# Patient Record
Sex: Female | Born: 1995 | Hispanic: Yes | Marital: Married | Smoking: Never smoker
Health system: Southern US, Community
[De-identification: ages and names within clinical notes are randomized; demographics above are authoritative.]

## PROBLEM LIST (undated history)

## (undated) DIAGNOSIS — J45909 Unspecified asthma, uncomplicated: Secondary | ICD-10-CM

## (undated) DIAGNOSIS — Z789 Other specified health status: Secondary | ICD-10-CM

## (undated) HISTORY — PX: APPENDECTOMY: SHX54

---

## 2018-12-08 ENCOUNTER — Other Ambulatory Visit: Payer: Self-pay

## 2018-12-08 DIAGNOSIS — Z20822 Contact with and (suspected) exposure to covid-19: Secondary | ICD-10-CM

## 2018-12-08 DIAGNOSIS — Z20828 Contact with and (suspected) exposure to other viral communicable diseases: Secondary | ICD-10-CM | POA: Diagnosis not present

## 2018-12-09 LAB — NOVEL CORONAVIRUS, NAA: SARS-CoV-2, NAA: NOT DETECTED

## 2018-12-10 ENCOUNTER — Telehealth: Payer: Self-pay | Admitting: Hematology

## 2018-12-10 NOTE — Telephone Encounter (Signed)
Pt is aware covid 19 test is neg interpreter [phone] camilo on 12-10-2018

## 2018-12-24 ENCOUNTER — Ambulatory Visit: Payer: Self-pay | Attending: Internal Medicine

## 2018-12-24 DIAGNOSIS — Z20828 Contact with and (suspected) exposure to other viral communicable diseases: Secondary | ICD-10-CM | POA: Diagnosis not present

## 2018-12-24 DIAGNOSIS — Z20822 Contact with and (suspected) exposure to covid-19: Secondary | ICD-10-CM

## 2018-12-24 DIAGNOSIS — U071 COVID-19: Secondary | ICD-10-CM | POA: Insufficient documentation

## 2018-12-25 LAB — NOVEL CORONAVIRUS, NAA: SARS-CoV-2, NAA: DETECTED — AB

## 2018-12-27 ENCOUNTER — Ambulatory Visit: Payer: Self-pay

## 2018-12-27 NOTE — Telephone Encounter (Signed)
Using interpreter [phone], Pt given Covid-19 positive results. Discussed mild, moderate and severe symptoms. Advised pt to call 911 for any respiratory issues and/dehydration. Discussed non test criteria for ending self isolation. Pt advised of way to manage symptoms at home and review isolation precautions especially the importance of washing hands frequently and wearing a mask when around others. Pt verbalized understanding. Will notify HD.  Answer Assessment - Initial Assessment Questions 1. REASON FOR CALL or QUESTION: "What is your reason for calling today?" or "How can I best help you?" or "What question do you have that I can help answer?"     Covid results  Protocols used: INFORMATION ONLY CALL-A-AH

## 2019-01-02 NOTE — L&D Delivery Note (Signed)
Delivery Summary for Stacey Wilcox  Labor Events:   Preterm labor: No data found  Rupture date: 09/24/2019  Rupture time: 8:01 AM  Rupture type: Artificial  Fluid Color: Clear  Induction: No data found  Augmentation: No data found  Complications: No data found  Cervical ripening: No data found No data found   No data found     Delivery:   Episiotomy: No data found  Lacerations: No data found  Repair suture: No data found  Repair # of packets: No data found  Blood loss (ml): 350   Information for the patient's newborn:  Daizee, Firmin [[id number]]    Delivery 09/24/2019 10:01 AM by  Vaginal, Spontaneous Sex:  female Gestational Age: [redacted]w[redacted]d Delivery Clinician:   Living?:         APGARS  One minute Five minutes Ten minutes  Skin color:        Heart rate:        Grimace:        Muscle tone:        Breathing:        Totals: 9  9      Presentation/position:      Resuscitation:   Cord information:    Disposition of cord blood:     Blood gases sent?  Complications:   Placenta: Delivered:       appearance Newborn Measurements: Weight: 5 lb 14.9 oz (2690 g)  Height: 19.76"  Head circumference:    Chest circumference:    Other providers:    Additional  information: Forceps:   Vacuum:   Breech:   Observed anomalies        Delivery Note At 10:01 AM a viable and healthy female "Diego" was delivered via Vaginal, Spontaneous (Presentation: Left Occiput Anterior).  APGAR: 9, 9; weight 5 lb 14.9 oz (2690 g).   Placenta status: Spontaneous, Intact.  Cord: 3-vessel with the following complications: None.  Cord pH: not obtained.  Delayed cord clamping observed for 2 minutes.   Anesthesia: Epidural Episiotomy: None Lacerations: 2nd degree Suture Repair: 3.0 vicryl rapide Est. Blood Loss (mL):  350 ml  Mom to postpartum.  Baby to Couplet care / Skin to Skin.  Hildred Laser 09/24/2019, 1:20 PM

## 2019-02-02 DIAGNOSIS — Z1389 Encounter for screening for other disorder: Secondary | ICD-10-CM | POA: Diagnosis not present

## 2019-02-02 DIAGNOSIS — O219 Vomiting of pregnancy, unspecified: Secondary | ICD-10-CM | POA: Diagnosis not present

## 2019-03-03 DIAGNOSIS — Z3481 Encounter for supervision of other normal pregnancy, first trimester: Secondary | ICD-10-CM | POA: Diagnosis not present

## 2019-03-03 DIAGNOSIS — Z1389 Encounter for screening for other disorder: Secondary | ICD-10-CM | POA: Diagnosis not present

## 2019-03-03 LAB — OB RESULTS CONSOLE VARICELLA ZOSTER ANTIBODY, IGG: Varicella: IMMUNE

## 2019-03-03 LAB — OB RESULTS CONSOLE HEPATITIS B SURFACE ANTIGEN: Hepatitis B Surface Ag: NEGATIVE

## 2019-03-03 LAB — OB RESULTS CONSOLE RUBELLA ANTIBODY, IGM: Rubella: NON-IMMUNE/NOT IMMUNE

## 2019-03-30 ENCOUNTER — Emergency Department: Payer: Medicaid Other

## 2019-03-30 ENCOUNTER — Encounter: Payer: Self-pay | Admitting: Emergency Medicine

## 2019-03-30 ENCOUNTER — Emergency Department
Admission: EM | Admit: 2019-03-30 | Discharge: 2019-03-31 | Disposition: A | Discharge status: Home / Self Care | Payer: Medicaid Other | Attending: Emergency Medicine | Admitting: Emergency Medicine

## 2019-03-30 DIAGNOSIS — Z3A16 16 weeks gestation of pregnancy: Secondary | ICD-10-CM | POA: Insufficient documentation

## 2019-03-30 DIAGNOSIS — R109 Unspecified abdominal pain: Secondary | ICD-10-CM | POA: Diagnosis not present

## 2019-03-30 DIAGNOSIS — R102 Pelvic and perineal pain: Secondary | ICD-10-CM | POA: Diagnosis not present

## 2019-03-30 DIAGNOSIS — O4402 Placenta previa specified as without hemorrhage, second trimester: Secondary | ICD-10-CM | POA: Diagnosis not present

## 2019-03-30 DIAGNOSIS — O26892 Other specified pregnancy related conditions, second trimester: Secondary | ICD-10-CM | POA: Diagnosis present

## 2019-03-30 DIAGNOSIS — R1030 Lower abdominal pain, unspecified: Secondary | ICD-10-CM | POA: Diagnosis not present

## 2019-03-30 DIAGNOSIS — O26891 Other specified pregnancy related conditions, first trimester: Secondary | ICD-10-CM | POA: Diagnosis not present

## 2019-03-30 LAB — URINALYSIS, COMPLETE (UACMP) WITH MICROSCOPIC
Bilirubin Urine: NEGATIVE
Glucose, UA: NEGATIVE mg/dL
Hgb urine dipstick: NEGATIVE
Ketones, ur: NEGATIVE mg/dL
Leukocytes,Ua: NEGATIVE
Nitrite: NEGATIVE
Protein, ur: NEGATIVE mg/dL
Specific Gravity, Urine: 1.008 (ref 1.005–1.030)
pH: 7 (ref 5.0–8.0)

## 2019-03-30 LAB — SAMPLE TO BLOOD BANK

## 2019-03-30 LAB — COMPREHENSIVE METABOLIC PANEL
ALT: 21 U/L (ref 0–44)
AST: 20 U/L (ref 15–41)
Albumin: 4 g/dL (ref 3.5–5.0)
Alkaline Phosphatase: 74 U/L (ref 38–126)
Anion gap: 11 (ref 5–15)
BUN: 5 mg/dL — ABNORMAL LOW (ref 6–20)
CO2: 23 mmol/L (ref 22–32)
Calcium: 9 mg/dL (ref 8.9–10.3)
Chloride: 102 mmol/L (ref 98–111)
Creatinine, Ser: 0.45 mg/dL (ref 0.44–1.00)
GFR calc Af Amer: >60 mL/min (ref >60)
GFR calc non Af Amer: >60 mL/min (ref >60)
Glucose, Bld: 88 mg/dL (ref 70–99)
Potassium: 3.5 mmol/L (ref 3.5–5.1)
Sodium: 136 mmol/L (ref 135–145)
Total Bilirubin: 0.3 mg/dL (ref 0.3–1.2)
Total Protein: 7.6 g/dL (ref 6.5–8.1)

## 2019-03-30 LAB — CBC
HCT: 38.5 % (ref 36.0–46.0)
Hemoglobin: 13.1 g/dL (ref 12.0–15.0)
MCH: 29.2 pg (ref 26.0–34.0)
MCHC: 34 g/dL (ref 30.0–36.0)
MCV: 85.7 fL (ref 80.0–100.0)
Platelets: 343 10*3/uL (ref 150–400)
RBC: 4.49 MIL/uL (ref 3.87–5.11)
RDW: 12.7 % (ref 11.5–15.5)
WBC: 13.5 10*3/uL — ABNORMAL HIGH (ref 4.0–10.5)
nRBC: 0 % (ref 0.0–0.2)

## 2019-03-30 LAB — LIPASE, BLOOD: Lipase: 25 U/L (ref 11–51)

## 2019-03-30 LAB — ABO/RH: ABO/RH(D): O POS

## 2019-03-30 LAB — HCG, QUANTITATIVE, PREGNANCY: hCG, Beta Chain, Quant, S: 26376 m[IU]/mL — ABNORMAL HIGH (ref <5)

## 2019-03-30 NOTE — ED Triage Notes (Signed)
Pt reports she is [redacted] weeks pregnant and today started to have lower pelvic pain and dysuria. [facility].

## 2019-03-30 NOTE — ED Notes (Signed)
Interpretor used for pt communication and through assessment with self and Dr. Derrill Kay. Pt states she is concerned she may be experiencing a miscarriage. States she is experiencing lower abdominal pain and pelvic pain. Tender to palpation in pubic region

## 2019-03-30 NOTE — ED Notes (Signed)
Pt ready for US

## 2019-03-30 NOTE — ED Provider Notes (Signed)
[facility] Emergency Department Provider Note   ____________________________________________   I have reviewed the triage vital signs and the nursing notes.   HISTORY  Chief Complaint Abdominal Pain   History limited by: [facility] Interpreter utilized   HPI Stacey Wilcox is a 24 y.o. female who presents to the emergency department today because of concerns for suprapubic pain.  She states that the pain started this morning. It has been constant. She denies any associated vaginal bleeding or abnormal vaginal discharge. She states that the pain does remind her of when she had a miscarriage last year. She has noticed some discomfort with urination. Denies any fevers. She states she did have an ultrasound on this pregnancy already.    Records reviewed.   There are no problems to display for this patient.   Prior to Admission medications   Not on File    Allergies Patient has no known allergies.  No family history on file.  Social History Social History   Tobacco Use  . Smoking status: Not on file  Substance Use Topics  . Alcohol use: Not on file  . Drug use: Not on file    Review of Systems Constitutional: No fever/chills Eyes: No visual changes. ENT: No sore throat. Cardiovascular: Denies chest pain. Respiratory: Denies shortness of breath. Gastrointestinal: Positive for suprapubic pain.  Genitourinary: Negative for dysuria. Musculoskeletal: Negative for back pain. Skin: Negative for rash. Neurological: Negative for headaches, focal weakness or numbness.  ____________________________________________   PHYSICAL EXAM:  VITAL SIGNS: ED Triage Vitals [03/30/19 2018]  Enc Vitals Group     BP (!) 145/80     Pulse Rate 86     Resp 17     Temp      Temp src      SpO2 100 %   Constitutional: Alert and oriented.  Eyes: Conjunctivae are normal.  ENT      Head: Normocephalic and atraumatic.      Nose:  No congestion/rhinnorhea.      Mouth/Throat: Mucous membranes are moist.      Neck: No stridor. Hematological/Lymphatic/Immunilogical: No cervical lymphadenopathy. Cardiovascular: Normal rate, regular rhythm.  No murmurs, rubs, or gallops.  Respiratory: Normal respiratory effort without tachypnea nor retractions. Breath sounds are clear and equal bilaterally. No wheezes/rales/rhonchi. Gastrointestinal: Soft and tender to palpation in the suprapubic region. Genitourinary: Deferred Musculoskeletal: Normal range of motion in all extremities. No lower extremity edema. Neurologic:  Normal speech and language. No gross focal neurologic deficits are appreciated.  Skin:  Skin is warm, dry and intact. No rash noted. Psychiatric: Tearful ____________________________________________    LABS (pertinent positives/negatives)  UA hazy, rare bacteria otherwise unremarkable CMP wnl except BUN 5 Lipase 25 CBC wbc 13.5, hgb 13.1, plt 343 ____________________________________________   EKG  None  ____________________________________________    RADIOLOGY  US ob pending at time of signout  ____________________________________________   PROCEDURES  Procedures  ____________________________________________   INITIAL IMPRESSION / ASSESSMENT AND PLAN / ED COURSE  Pertinent labs & imaging results that were available during my care of the patient were reviewed by me and considered in my medical decision making (see chart for details).   Patient presented to the emergency department today because of concern for suprapubic pain in the setting of pregnancy. She had a miscarriage last year and feels like this pain is similar. No concerning signs for UTI in UA. Will obtain US to evaluate.    ____________________________________________   FINAL CLINICAL IMPRESSION(S) /  ED DIAGNOSES  Suprapubic pain  Note: This dictation was prepared with Dragon dictation. Any transcriptional errors that result  from this process are unintentional     Phineas Semen, MD 03/30/19 [phone]

## 2019-03-30 NOTE — ED Notes (Signed)
PT to US

## 2019-03-31 DIAGNOSIS — Z3481 Encounter for supervision of other normal pregnancy, first trimester: Secondary | ICD-10-CM | POA: Diagnosis not present

## 2019-03-31 DIAGNOSIS — Z3A16 16 weeks gestation of pregnancy: Secondary | ICD-10-CM | POA: Diagnosis not present

## 2019-03-31 DIAGNOSIS — O26891 Other specified pregnancy related conditions, first trimester: Secondary | ICD-10-CM | POA: Diagnosis not present

## 2019-03-31 DIAGNOSIS — R102 Pelvic and perineal pain: Secondary | ICD-10-CM | POA: Diagnosis not present

## 2019-03-31 DIAGNOSIS — O4402 Placenta previa specified as without hemorrhage, second trimester: Secondary | ICD-10-CM | POA: Diagnosis not present

## 2019-03-31 LAB — URINE CULTURE: Culture: <10000 — AB

## 2019-03-31 NOTE — ED Provider Notes (Signed)
-----------------------------------------   12:44 AM on 03/31/2019 -----------------------------------------  OB ultrasound interpreted per Dr. Karle Starch:  Single live intrauterine gestation at 16 weeks 0 days.    Low lying placenta is noted at this time. Follow-up can be performed  as clinically indicated.   Updated patient ultrasound results.  She will follow-up with OB/GYN closely.  Strict return precautions given.  Patient verbalizes understanding and agrees with plan of care.   Irean Hong, MD 03/31/19 [phone]

## 2019-03-31 NOTE — Discharge Instructions (Signed)
You may take Tylenol as needed for discomfort.  Practice pelvic rest until seen by your doctor.  Urine culture is pending.  We will notify you of any positive results.  Return to the ER for worsening symptoms, persistent vomiting, difficulty breathing or other concerns.

## 2019-04-20 DIAGNOSIS — Z20822 Contact with and (suspected) exposure to covid-19: Secondary | ICD-10-CM | POA: Diagnosis not present

## 2019-04-28 DIAGNOSIS — Z1389 Encounter for screening for other disorder: Secondary | ICD-10-CM | POA: Diagnosis not present

## 2019-04-28 DIAGNOSIS — Z3482 Encounter for supervision of other normal pregnancy, second trimester: Secondary | ICD-10-CM | POA: Diagnosis not present

## 2019-05-26 DIAGNOSIS — Z3A23 23 weeks gestation of pregnancy: Secondary | ICD-10-CM | POA: Diagnosis not present

## 2019-05-26 DIAGNOSIS — O26892 Other specified pregnancy related conditions, second trimester: Secondary | ICD-10-CM | POA: Diagnosis not present

## 2019-05-26 DIAGNOSIS — Z362 Encounter for other antenatal screening follow-up: Secondary | ICD-10-CM | POA: Diagnosis not present

## 2019-05-27 DIAGNOSIS — Z3482 Encounter for supervision of other normal pregnancy, second trimester: Secondary | ICD-10-CM | POA: Diagnosis not present

## 2019-06-22 DIAGNOSIS — Z1389 Encounter for screening for other disorder: Secondary | ICD-10-CM | POA: Diagnosis not present

## 2019-06-22 DIAGNOSIS — Z23 Encounter for immunization: Secondary | ICD-10-CM | POA: Diagnosis not present

## 2019-06-22 DIAGNOSIS — Z3482 Encounter for supervision of other normal pregnancy, second trimester: Secondary | ICD-10-CM | POA: Diagnosis not present

## 2019-06-23 LAB — OB RESULTS CONSOLE RPR: RPR: NONREACTIVE

## 2019-06-23 LAB — OB RESULTS CONSOLE HIV ANTIBODY (ROUTINE TESTING): HIV: NONREACTIVE

## 2019-07-02 DIAGNOSIS — Z419 Encounter for procedure for purposes other than remedying health state, unspecified: Secondary | ICD-10-CM | POA: Diagnosis not present

## 2019-07-09 DIAGNOSIS — Z3482 Encounter for supervision of other normal pregnancy, second trimester: Secondary | ICD-10-CM | POA: Diagnosis not present

## 2019-07-09 DIAGNOSIS — Z1389 Encounter for screening for other disorder: Secondary | ICD-10-CM | POA: Diagnosis not present

## 2019-07-23 DIAGNOSIS — O09613 Supervision of young primigravida, third trimester: Secondary | ICD-10-CM | POA: Diagnosis not present

## 2019-07-23 DIAGNOSIS — Z1389 Encounter for screening for other disorder: Secondary | ICD-10-CM | POA: Diagnosis not present

## 2019-08-02 DIAGNOSIS — Z419 Encounter for procedure for purposes other than remedying health state, unspecified: Secondary | ICD-10-CM | POA: Diagnosis not present

## 2019-08-06 DIAGNOSIS — Z1389 Encounter for screening for other disorder: Secondary | ICD-10-CM | POA: Diagnosis not present

## 2019-08-06 DIAGNOSIS — O09613 Supervision of young primigravida, third trimester: Secondary | ICD-10-CM | POA: Diagnosis not present

## 2019-08-20 DIAGNOSIS — Z1389 Encounter for screening for other disorder: Secondary | ICD-10-CM | POA: Diagnosis not present

## 2019-08-20 DIAGNOSIS — O09613 Supervision of young primigravida, third trimester: Secondary | ICD-10-CM | POA: Diagnosis not present

## 2019-08-21 LAB — OB RESULTS CONSOLE GC/CHLAMYDIA
Chlamydia: NEGATIVE
Gonorrhea: NEGATIVE

## 2019-08-22 LAB — OB RESULTS CONSOLE GBS: GBS: NEGATIVE

## 2019-08-27 DIAGNOSIS — Z3403 Encounter for supervision of normal first pregnancy, third trimester: Secondary | ICD-10-CM | POA: Diagnosis not present

## 2019-08-27 DIAGNOSIS — Z1389 Encounter for screening for other disorder: Secondary | ICD-10-CM | POA: Diagnosis not present

## 2019-09-02 DIAGNOSIS — Z419 Encounter for procedure for purposes other than remedying health state, unspecified: Secondary | ICD-10-CM | POA: Diagnosis not present

## 2019-09-03 DIAGNOSIS — Z3403 Encounter for supervision of normal first pregnancy, third trimester: Secondary | ICD-10-CM | POA: Diagnosis not present

## 2019-09-10 DIAGNOSIS — Z1389 Encounter for screening for other disorder: Secondary | ICD-10-CM | POA: Diagnosis not present

## 2019-09-10 DIAGNOSIS — O09613 Supervision of young primigravida, third trimester: Secondary | ICD-10-CM | POA: Diagnosis not present

## 2019-09-17 DIAGNOSIS — Z1389 Encounter for screening for other disorder: Secondary | ICD-10-CM | POA: Diagnosis not present

## 2019-09-17 DIAGNOSIS — O09613 Supervision of young primigravida, third trimester: Secondary | ICD-10-CM | POA: Diagnosis not present

## 2019-09-21 DIAGNOSIS — Z20822 Contact with and (suspected) exposure to covid-19: Secondary | ICD-10-CM | POA: Diagnosis not present

## 2019-09-21 DIAGNOSIS — O09613 Supervision of young primigravida, third trimester: Secondary | ICD-10-CM | POA: Diagnosis not present

## 2019-09-23 ENCOUNTER — Observation Stay
Admission: EM | Admit: 2019-09-23 | Discharge: 2019-09-23 | Disposition: A | Discharge status: Home / Self Care | Payer: Medicaid Other | Attending: Obstetrics and Gynecology | Admitting: Obstetrics and Gynecology

## 2019-09-23 ENCOUNTER — Other Ambulatory Visit: Payer: Self-pay

## 2019-09-23 DIAGNOSIS — Z3490 Encounter for supervision of normal pregnancy, unspecified, unspecified trimester: Secondary | ICD-10-CM

## 2019-09-23 DIAGNOSIS — Z3A40 40 weeks gestation of pregnancy: Secondary | ICD-10-CM | POA: Diagnosis not present

## 2019-09-23 DIAGNOSIS — O471 False labor at or after 37 completed weeks of gestation: Secondary | ICD-10-CM | POA: Diagnosis not present

## 2019-09-23 DIAGNOSIS — O480 Post-term pregnancy: Secondary | ICD-10-CM | POA: Diagnosis present

## 2019-09-23 DIAGNOSIS — O479 False labor, unspecified: Secondary | ICD-10-CM | POA: Diagnosis present

## 2019-09-23 NOTE — OB Triage Note (Signed)
Discharge instructions and labor precautions reviewed with patient, father of baby, and mother of patient.

## 2019-09-23 NOTE — Final Progress Note (Signed)
L&D OB Triage Note  Stacey Wilcox is an unassigned 24 y.o. G1P0 female at [redacted]w[redacted]d, EDD Estimated Date of Delivery: 09/18/19 who presented to triage for complaints of contractions since this morning. She receives her care at [facility]. She was seen at [facility] this morning for contractions and was noted to be dilated 3 cm, but did not progress any further. She is still noting contractions every 4 minutes and pain of 10/10. She was evaluated by the nurses with no significant findings for active labor.  Vital signs stable. An NST was performed and has been reviewed by MD.   Physical Exam:  Blood pressure 127/86, temperature 98.7 F (37.1 C), temperature source Oral, resp. rate 12. Dilation: 3 Effacement (%): 80, 90 Cervical Position: Middle Station: -2 Presentation: Vertex Exam by:: A Sherlon Handing RN     NST INTERPRETATION: Indications: rule out uterine contractions  Mode: External Baseline Rate (A): 130 bpm Variability: Moderate Accelerations: 10 x 10 Decelerations: None     Contraction Frequency (min): irriability with occasional contractions  Impression: reactive   Plan: NST performed was reviewed and was found to be reactive. No further cervical change noted during labor evaluation. She was discharged home with bleeding/labor precautions.  Continue routine prenatal care. Follow up with OB/GYN as previously scheduled.     Hildred Laser, MD

## 2019-09-23 NOTE — Discharge Instructions (Signed)
Return to hospital for any of the following:  -contractions at least 5 minutes apart  -decreased fetal movement  -vaginal bleeding  -leakage of fluid  

## 2019-09-23 NOTE — OB Triage Note (Signed)
Pt reports being at [facility] this morning and dilated to 3cm. She was sent home and has returned here for ctx every 4 mins apart and rating them 10/10. Cervical check now is 3/90/-2.

## 2019-09-24 ENCOUNTER — Inpatient Hospital Stay: Payer: Medicaid Other | Admitting: Anesthesiology

## 2019-09-24 ENCOUNTER — Inpatient Hospital Stay
Admission: EM | Admit: 2019-09-24 | Discharge: 2019-09-25 | DRG: 807 | Disposition: A | Discharge status: Home / Self Care | Payer: Medicaid Other | Attending: Obstetrics and Gynecology | Admitting: Obstetrics and Gynecology

## 2019-09-24 ENCOUNTER — Other Ambulatory Visit: Payer: Self-pay

## 2019-09-24 DIAGNOSIS — O99214 Obesity complicating childbirth: Secondary | ICD-10-CM | POA: Diagnosis not present

## 2019-09-24 DIAGNOSIS — Z3A40 40 weeks gestation of pregnancy: Secondary | ICD-10-CM

## 2019-09-24 DIAGNOSIS — Z20822 Contact with and (suspected) exposure to covid-19: Secondary | ICD-10-CM | POA: Diagnosis present

## 2019-09-24 DIAGNOSIS — O480 Post-term pregnancy: Secondary | ICD-10-CM | POA: Diagnosis not present

## 2019-09-24 DIAGNOSIS — O26893 Other specified pregnancy related conditions, third trimester: Secondary | ICD-10-CM | POA: Diagnosis not present

## 2019-09-24 HISTORY — DX: Other specified health status: Z78.9

## 2019-09-24 LAB — CBC
HCT: 38.2 % (ref 36.0–46.0)
Hemoglobin: 13 g/dL (ref 12.0–15.0)
MCH: 28.4 pg (ref 26.0–34.0)
MCHC: 34 g/dL (ref 30.0–36.0)
MCV: 83.6 fL (ref 80.0–100.0)
Platelets: 292 10*3/uL (ref 150–400)
RBC: 4.57 MIL/uL (ref 3.87–5.11)
RDW: 14.7 % (ref 11.5–15.5)
WBC: 16.6 10*3/uL — ABNORMAL HIGH (ref 4.0–10.5)
nRBC: 0 % (ref 0.0–0.2)

## 2019-09-24 LAB — CHLAMYDIA/NGC RT PCR (ARMC ONLY)
Chlamydia Tr: NOT DETECTED
N gonorrhoeae: NOT DETECTED

## 2019-09-24 LAB — SARS CORONAVIRUS 2 BY RT PCR (HOSPITAL ORDER, PERFORMED IN ~~LOC~~ HOSPITAL LAB): SARS Coronavirus 2: NEGATIVE

## 2019-09-24 LAB — TYPE AND SCREEN
ABO/RH(D): O POS
Antibody Screen: NEGATIVE

## 2019-09-24 LAB — ABO/RH: ABO/RH(D): O POS

## 2019-09-24 MED ORDER — ONDANSETRON HCL 4 MG PO TABS
4.0000 mg | ORAL_TABLET | ORAL | Status: DC | PRN
  Filled 2019-09-24: qty 1

## 2019-09-24 MED ORDER — ACETAMINOPHEN 325 MG PO TABS
ORAL_TABLET | ORAL | Status: AC
  Filled 2019-09-24: qty 2

## 2019-09-24 MED ORDER — SOD CITRATE-CITRIC ACID 500-334 MG/5ML PO SOLN: 30.0000 mL | ORAL | Status: DC | PRN

## 2019-09-24 MED ORDER — FENTANYL 2.5 MCG/ML W/ROPIVACAINE 0.15% IN NS 100 ML EPIDURAL (ARMC): 12.0000 mL/h | EPIDURAL | Status: DC

## 2019-09-24 MED ORDER — TETANUS-DIPHTH-ACELL PERTUSSIS 5-2.5-18.5 LF-MCG/0.5 IM SUSP
0.5000 mL | Freq: Once | INTRAMUSCULAR | Status: DC
  Filled 2019-09-24: qty 0.5

## 2019-09-24 MED ORDER — SODIUM CHLORIDE 0.9 % IV SOLN
INTRAVENOUS | Status: DC | PRN
  Administered 2019-09-24: 10 mL via EPIDURAL

## 2019-09-24 MED ORDER — ONDANSETRON HCL 4 MG/2ML IJ SOLN: 4.0000 mg | Freq: Four times a day (QID) | INTRAMUSCULAR | Status: DC | PRN

## 2019-09-24 MED ORDER — MISOPROSTOL 200 MCG PO TABS
ORAL_TABLET | ORAL | Status: AC
  Filled 2019-09-24: qty 4

## 2019-09-24 MED ORDER — OXYTOCIN-SODIUM CHLORIDE 30-0.9 UT/500ML-% IV SOLN
1.0000 m[IU]/min | INTRAVENOUS | Status: DC
  Administered 2019-09-24: 4 m[IU]/min via INTRAVENOUS

## 2019-09-24 MED ORDER — OXYTOCIN-SODIUM CHLORIDE 30-0.9 UT/500ML-% IV SOLN
INTRAVENOUS | Status: AC
  Filled 2019-09-24: qty 500

## 2019-09-24 MED ORDER — OXYTOCIN-SODIUM CHLORIDE 30-0.9 UT/500ML-% IV SOLN: 1.0000 m[IU]/min | INTRAVENOUS | Status: DC

## 2019-09-24 MED ORDER — PHENYLEPHRINE 40 MCG/ML (10ML) SYRINGE FOR IV PUSH (FOR BLOOD PRESSURE SUPPORT): 80.0000 ug | PREFILLED_SYRINGE | INTRAVENOUS | Status: DC | PRN

## 2019-09-24 MED ORDER — LIDOCAINE-EPINEPHRINE (PF) 1.5 %-1:200000 IJ SOLN
INTRAMUSCULAR | Status: DC | PRN
  Administered 2019-09-24: 3 mL via EPIDURAL

## 2019-09-24 MED ORDER — ACETAMINOPHEN 325 MG PO TABS
650.0000 mg | ORAL_TABLET | ORAL | Status: DC | PRN
  Administered 2019-09-24 – 2019-09-25 (×2): 650 mg via ORAL
  Filled 2019-09-24 (×2): qty 2

## 2019-09-24 MED ORDER — ONDANSETRON HCL 4 MG/2ML IJ SOLN: 4.0000 mg | INTRAMUSCULAR | Status: DC | PRN

## 2019-09-24 MED ORDER — DIPHENHYDRAMINE HCL 50 MG/ML IJ SOLN: 12.5000 mg | INTRAMUSCULAR | Status: DC | PRN

## 2019-09-24 MED ORDER — WITCH HAZEL-GLYCERIN EX PADS: 1.0000 "application " | MEDICATED_PAD | CUTANEOUS | Status: DC | PRN

## 2019-09-24 MED ORDER — LIDOCAINE HCL (PF) 1 % IJ SOLN
INTRAMUSCULAR | Status: AC
  Filled 2019-09-24: qty 30

## 2019-09-24 MED ORDER — DIBUCAINE (PERIANAL) 1 % EX OINT: 1.0000 "application " | TOPICAL_OINTMENT | CUTANEOUS | Status: DC | PRN

## 2019-09-24 MED ORDER — TERBUTALINE SULFATE 1 MG/ML IJ SOLN: 0.2500 mg | Freq: Once | INTRAMUSCULAR | Status: DC | PRN

## 2019-09-24 MED ORDER — ZOLPIDEM TARTRATE 5 MG PO TABS: 5.0000 mg | ORAL_TABLET | Freq: Every evening | ORAL | Status: DC | PRN

## 2019-09-24 MED ORDER — LIDOCAINE HCL (PF) 1 % IJ SOLN: 30.0000 mL | INTRAMUSCULAR | Status: DC | PRN

## 2019-09-24 MED ORDER — SENNOSIDES-DOCUSATE SODIUM 8.6-50 MG PO TABS
2.0000 | ORAL_TABLET | ORAL | Status: DC
  Administered 2019-09-24: 2 via ORAL
  Filled 2019-09-24: qty 2

## 2019-09-24 MED ORDER — PRENATAL MULTIVITAMIN CH
1.0000 | ORAL_TABLET | Freq: Every day | ORAL | Status: DC
  Administered 2019-09-24 – 2019-09-25 (×2): 1 via ORAL
  Filled 2019-09-24 (×2): qty 1

## 2019-09-24 MED ORDER — IBUPROFEN 600 MG PO TABS
ORAL_TABLET | ORAL | Status: AC
  Filled 2019-09-24: qty 1

## 2019-09-24 MED ORDER — OXYTOCIN-SODIUM CHLORIDE 30-0.9 UT/500ML-% IV SOLN
2.5000 [IU]/h | INTRAVENOUS | Status: DC
  Administered 2019-09-24: 2.5 [IU]/h via INTRAVENOUS
  Filled 2019-09-24: qty 500

## 2019-09-24 MED ORDER — OXYTOCIN 10 UNIT/ML IJ SOLN
INTRAMUSCULAR | Status: AC
  Filled 2019-09-24: qty 2

## 2019-09-24 MED ORDER — BUTORPHANOL TARTRATE 1 MG/ML IJ SOLN: 1.0000 mg | INTRAMUSCULAR | Status: DC | PRN

## 2019-09-24 MED ORDER — BENZOCAINE-MENTHOL 20-0.5 % EX AERO
1.0000 "application " | INHALATION_SPRAY | CUTANEOUS | Status: DC | PRN
  Administered 2019-09-24: 1 via TOPICAL
  Filled 2019-09-24: qty 56

## 2019-09-24 MED ORDER — OXYCODONE-ACETAMINOPHEN 5-325 MG PO TABS: 1.0000 | ORAL_TABLET | ORAL | Status: DC | PRN

## 2019-09-24 MED ORDER — EPHEDRINE 5 MG/ML INJ: 10.0000 mg | INTRAVENOUS | Status: DC | PRN

## 2019-09-24 MED ORDER — FENTANYL 2.5 MCG/ML W/ROPIVACAINE 0.15% IN NS 100 ML EPIDURAL (ARMC)
EPIDURAL | Status: AC
  Filled 2019-09-24: qty 100

## 2019-09-24 MED ORDER — LACTATED RINGERS IV SOLN
500.0000 mL | Freq: Once | INTRAVENOUS | Status: AC
  Administered 2019-09-24: 500 mL via INTRAVENOUS

## 2019-09-24 MED ORDER — DIPHENHYDRAMINE HCL 25 MG PO CAPS: 25.0000 mg | ORAL_CAPSULE | Freq: Four times a day (QID) | ORAL | Status: DC | PRN

## 2019-09-24 MED ORDER — FENTANYL 2.5 MCG/ML W/ROPIVACAINE 0.15% IN NS 100 ML EPIDURAL (ARMC)
EPIDURAL | Status: DC | PRN
Start: 2019-09-24 — End: 2019-09-24
  Administered 2019-09-24: 12 mL/h via EPIDURAL

## 2019-09-24 MED ORDER — SIMETHICONE 80 MG PO CHEW: 80.0000 mg | CHEWABLE_TABLET | ORAL | Status: DC | PRN

## 2019-09-24 MED ORDER — COCONUT OIL OIL: 1.0000 "application " | TOPICAL_OIL | Status: DC | PRN

## 2019-09-24 MED ORDER — LACTATED RINGERS IV BOLUS
1000.0000 mL | Freq: Once | INTRAVENOUS | Status: AC
  Administered 2019-09-24: 1000 mL via INTRAVENOUS

## 2019-09-24 MED ORDER — IBUPROFEN 600 MG PO TABS
600.0000 mg | ORAL_TABLET | Freq: Four times a day (QID) | ORAL | Status: DC
  Administered 2019-09-24 – 2019-09-25 (×4): 600 mg via ORAL
  Filled 2019-09-24 (×4): qty 1

## 2019-09-24 MED ORDER — OXYTOCIN BOLUS FROM INFUSION
333.0000 mL | Freq: Once | INTRAVENOUS | Status: AC
  Administered 2019-09-24: 333 mL via INTRAVENOUS

## 2019-09-24 MED ORDER — LACTATED RINGERS IV SOLN
125.0000 mL/h | INTRAVENOUS | Status: DC
  Administered 2019-09-24: 125 mL/h via INTRAVENOUS

## 2019-09-24 MED ORDER — LIDOCAINE HCL (PF) 1 % IJ SOLN
INTRAMUSCULAR | Status: DC | PRN
  Administered 2019-09-24: 3 mL

## 2019-09-24 MED ORDER — LACTATED RINGERS IV SOLN: 500.0000 mL | INTRAVENOUS | Status: DC | PRN

## 2019-09-24 MED ORDER — AMMONIA AROMATIC IN INHA
RESPIRATORY_TRACT | Status: AC
  Filled 2019-09-24: qty 10

## 2019-09-24 MED ORDER — OXYCODONE-ACETAMINOPHEN 5-325 MG PO TABS: 2.0000 | ORAL_TABLET | ORAL | Status: DC | PRN

## 2019-09-24 NOTE — H&P (Signed)
Obstetric History and Physical  Video Language Line interpreter Edelin, used for Spanish interpretation.   Stacey Wilcox is an unassigned 24 y.o. G1P0 with IUP at [redacted]w[redacted]d presenting for complaints of worsening contractions and bloody show. Of note, patient was seen in triage last night due to contractions, but was deemed not to be in labor at that evaluation. Patient states she has been having  regular, every 4-5 minutes contractions, minimal vaginal bleeding, intact membranes, with active fetal movement.    Prenatal Course Source of Care: [facility] with onset of care at 10 weeks Pregnancy complications or risks: Patient Active Problem List   Diagnosis Date Noted  . Indication for care or intervention in labor or delivery 09/24/2019  . Post-dates pregnancy 09/24/2019  . Labor and delivery indication for care or intervention 09/24/2019   She plans to bottle feed She desires unsure method for postpartum contraception.   Prenatal labs and studies: ABO, Rh: --/--/O POS Performed at [facility], [address] [place], [place] [address]  [phone]) Antibody: NEG (09/23 7858) Rubella: Nonimmune (03/02 0000) RPR: Nonreactive (06/22 0000)  HBsAg: Negative (03/02 0000)  HIV: Non-reactive (06/22 0000)  IFO:YDXAJOIN/-- (08/21 0000) 1 hr Glucola  normal Genetic screening normal Anatomy US normal   Past Medical History:  Diagnosis Date  . Medical history non-contributory     Past Surgical History:  Procedure Laterality Date  . APPENDECTOMY      OB History  Gravida Para Term Preterm AB Living  1            SAB TAB Ectopic Multiple Live Births               # Outcome Date GA Lbr Len/2nd Weight Sex Delivery Anes PTL Lv  1 Current             Social History   Socioeconomic History  . Marital status: Not on file    Spouse name: Not on file  . Number of children: Not on file  . Years of education: Not on file  . Highest education level: Not on file   Occupational History  . Not on file  Tobacco Use  . Smoking status: Never Smoker  . Smokeless tobacco: Never Used  Substance and Sexual Activity  . Alcohol use: Not Currently  . Drug use: Not Currently  . Sexual activity: Yes  Other Topics Concern  . Not on file  Social History Narrative  . Not on file   Social Determinants of Health   Financial Resource Strain:   . Difficulty of Paying Living Expenses: Not on file  Food Insecurity:   . Worried About Programme researcher, broadcasting/film/video in the Last Year: Not on file  . Ran Out of Food in the Last Year: Not on file  Transportation Needs:   . Lack of Transportation (Medical): Not on file  . Lack of Transportation (Non-Medical): Not on file  Physical Activity:   . Days of Exercise per Week: Not on file  . Minutes of Exercise per Session: Not on file  Stress:   . Feeling of Stress : Not on file  Social Connections:   . Frequency of Communication with Friends and Family: Not on file  . Frequency of Social Gatherings with Friends and Family: Not on file  . Attends Religious Services: Not on file  . Active Member of Clubs or Organizations: Not on file  . Attends Banker Meetings: Not on file  . Marital Status:  Not on file    History reviewed. No pertinent family history.  Medications Prior to Admission  Medication Sig Dispense Refill Last Dose  . prenatal vitamin w/FE, FA (PRENATAL 1 + 1) 27-1 MG TABS tablet Take 1 tablet by mouth daily at 12 noon.   09/23/2019 at Unknown time    No Known Allergies  Review of Systems: Negative except for what is mentioned in HPI.  Physical Exam: BP 120/77 (BP Location: Left Arm)   Pulse 74   Temp 98.6 F (37 C) (Oral)   Resp 14   Ht 5' 1.02" (1.55 m)   Wt 98.9 kg   SpO2 98%   BMI 41.16 kg/m  CONSTITUTIONAL: Well-developed, well-nourished female in no acute distress.  HENT:  Normocephalic, atraumatic, External right and left ear normal. Oropharynx is clear and moist EYES:  Conjunctivae and EOM are normal. Pupils are equal, round, and reactive to light. No scleral icterus.  NECK: Normal range of motion, supple, no masses SKIN: Skin is warm and dry. No rash noted. Not diaphoretic. No erythema. No pallor. NEUROLOGIC: Alert and oriented to person, place, and time. Normal reflexes, muscle tone coordination. No cranial nerve deficit noted. PSYCHIATRIC: Normal mood and affect. Normal behavior. Normal judgment and thought content. CARDIOVASCULAR: Normal heart rate noted, regular rhythm RESPIRATORY: Effort and breath sounds normal, no problems with respiration noted ABDOMEN: Soft, nontender, nondistended, gravid. MUSCULOSKELETAL: Normal range of motion. No edema and no tenderness. 2+ distal pulses.  Cervical Exam: Dilatation 7 cm   Effacement 80%   Station -1 to 0   Presentation: cephalic FHT:  Baseline rate 130 bpm   Variability moderate  Accelerations present   Decelerations none Contractions: Every 2-7 mins   Pertinent Labs/Studies:   Results for orders placed or performed during the hospital encounter of 09/24/19 (from the past 24 hour(s))  SARS Coronavirus 2 by RT PCR (hospital order, performed in [facility] hospital lab) Nasopharyngeal Nasopharyngeal Swab     Status: None   Collection Time: 09/24/19  3:40 AM   Specimen: Nasopharyngeal Swab  Result Value Ref Range   SARS Coronavirus 2 NEGATIVE NEGATIVE  CBC     Status: Abnormal   Collection Time: 09/24/19  3:40 AM  Result Value Ref Range   WBC 16.6 (H) 4.0 - 10.5 K/uL   RBC 4.57 3.87 - 5.11 MIL/uL   Hemoglobin 13.0 12.0 - 15.0 g/dL   HCT 13.0 36 - 46 %   MCV 83.6 80.0 - 100.0 fL   MCH 28.4 26.0 - 34.0 pg   MCHC 34.0 30.0 - 36.0 g/dL   RDW 86.5 78.4 - 69.6 %   Platelets 292 150 - 400 K/uL   nRBC 0.0 0.0 - 0.2 %  Chlamydia/NGC rt PCR ([facility] only)     Status: None   Collection Time: 09/24/19  3:40 AM   Specimen: Urine; GU  Result Value Ref Range   Specimen source GC/Chlam URINE, RANDOM    Chlamydia Tr  NOT DETECTED NOT DETECTED   N gonorrhoeae NOT DETECTED NOT DETECTED  Type and screen     Status: None   Collection Time: 09/24/19  6:47 AM  Result Value Ref Range   ABO/RH(D) O POS    Antibody Screen NEG    Sample Expiration      09/27/2019,2359 Performed at [facility], [address], [place], [place] [address]   ABO/Rh     Status: None   Collection Time: 09/24/19  6:52 AM  Result Value Ref Range  ABO/RH(D)      [facility] [place] POS Performed at [facility], [address], [place], [place] [address]     Assessment : Stacey Wilcox is a 24 y.o. G1P0 at [redacted]w[redacted]d being admitted for labor.   Plan: Labor: Have been managing expectantly, but cervical exam unchanged after 2 hrs. Contractions noted to space out after epidural placement. AROM'd with scan meconium-stained fluid. Will perform Augmentation with Pitocin.  FWB: Reassuring fetal heart tracing.  GBS negative Delivery plan: Hopeful for vaginal delivery    Hildred Laser, MD [facility]

## 2019-09-24 NOTE — Anesthesia Preprocedure Evaluation (Signed)
Anesthesia Evaluation  Patient identified by MRN, date of birth, ID band Patient awake    Reviewed: Allergy & Precautions, NPO status , Patient's Chart, lab work & pertinent test results  History of Anesthesia Complications Negative for: history of anesthetic complications  Airway Mallampati: III  TM Distance: >3 FB Neck ROM: Full    Dental no notable dental hx. (+) Teeth Intact   Pulmonary neg pulmonary ROS, neg sleep apnea, neg COPD, Patient abstained from smoking.Not current smoker,    Pulmonary exam normal breath sounds clear to auscultation       Cardiovascular Exercise Tolerance: Good METS(-) hypertension(-) CAD and (-) Past MI negative cardio ROS  (-) dysrhythmias  Rhythm:Regular Rate:Normal - Systolic murmurs    Neuro/Psych negative neurological ROS  negative psych ROS   GI/Hepatic neg GERD  ,(+)     (-) substance abuse  ,   Endo/Other  neg diabetesMorbid obesity  Renal/GU negative Renal ROS     Musculoskeletal   Abdominal   Peds  Hematology   Anesthesia Other Findings Past Medical History: No date: Medical history non-contributory  Reproductive/Obstetrics (+) Pregnancy                             Anesthesia Physical Anesthesia Plan  ASA: III  Anesthesia Plan: Epidural   Post-op Pain Management:    Induction:   PONV Risk Score and Plan: 3 and Treatment may vary due to age or medical condition and Ondansetron  Airway Management Planned: Natural Airway  Additional Equipment:   Intra-op Plan:   Post-operative Plan:   Informed Consent: I have reviewed the patients History and Physical, chart, labs and discussed the procedure including the risks, benefits and alternatives for the proposed anesthesia with the patient or authorized representative who has indicated his/her understanding and acceptance.     Interpreter used for [employer] (bedside nurse Ariceli acting  as interpreter)  Plan Discussed with: Surgeon  Anesthesia Plan Comments: (Discussed R/B/A of neuraxial anesthesia technique with patient: - rare risks of spinal/epidural hematoma, nerve damage, infection - Risk of PDPH - Risk of itching - Risk of nausea and vomiting - Risk of poor block necessitating replacement of epidural. Patient voiced understanding.)        Anesthesia Quick Evaluation

## 2019-09-24 NOTE — Anesthesia Procedure Notes (Signed)
Epidural Patient location during procedure: OB Start time: 09/24/2019 5:04 AM End time: 09/24/2019 5:26 AM  Staffing Anesthesiologist: Corinda Gubler, MD Performed: anesthesiologist   Preanesthetic Checklist Completed: patient identified, IV checked, site marked, risks and benefits discussed, surgical consent, monitors and equipment checked, pre-op evaluation and timeout performed  Epidural Patient position: sitting Prep: ChloraPrep Patient monitoring: heart rate, continuous pulse ox and blood pressure Approach: midline Location: L3-L4 Injection technique: LOR saline  Needle:  Needle type: Tuohy  Needle gauge: 17 G Needle length: 9 cm and 9 Needle insertion depth: 6 cm Catheter type: closed end flexible Catheter size: 19 Gauge Catheter at skin depth: 11 cm Test dose: negative and 1.5% lidocaine with Epi 1:200 K  Assessment Sensory level: T10 Events: blood not aspirated, injection not painful, no injection resistance, no paresthesia and negative IV test  Additional Notes one attempt Pt. Evaluated and documentation done after procedure finished. Patient identified. Risks/Benefits/Options discussed with patient including but not limited to bleeding, infection, nerve damage, paralysis, failed block, incomplete pain control, headache, blood pressure changes, nausea, vomiting, reactions to medication both or allergic, itching and postpartum back pain. Confirmed with bedside nurse the patient's most recent platelet count. Confirmed with patient that they are not currently taking any anticoagulation, have any bleeding history or any family history of bleeding disorders. Patient expressed understanding and wished to proceed. All questions were answered. Sterile technique was used throughout the entire procedure. Please see nursing notes for vital signs. Test dose was given through epidural catheter and negative prior to continuing to dose epidural or start infusion. Warning signs of high block  given to the patient including shortness of breath, tingling/numbness in hands, complete motor block, or any concerning symptoms with instructions to call for help. Patient was given instructions on fall risk and not to get out of bed. All questions and concerns addressed with instructions to call with any issues or inadequate analgesia.   Patient tolerated the insertion well without immediate complications.Reason for block:procedure for pain

## 2019-09-24 NOTE — OB Triage Note (Signed)
Pt returns to unit c/o contractions and increased bloody show since being sent home earlier tonight. Pt reports +FM, denies LOF.

## 2019-09-25 LAB — CBC
HCT: 35.4 % — ABNORMAL LOW (ref 36.0–46.0)
Hemoglobin: 11.9 g/dL — ABNORMAL LOW (ref 12.0–15.0)
MCH: 29.2 pg (ref 26.0–34.0)
MCHC: 33.6 g/dL (ref 30.0–36.0)
MCV: 86.8 fL (ref 80.0–100.0)
Platelets: 236 10*3/uL (ref 150–400)
RBC: 4.08 MIL/uL (ref 3.87–5.11)
RDW: 15.3 % (ref 11.5–15.5)
WBC: 16.5 10*3/uL — ABNORMAL HIGH (ref 4.0–10.5)
nRBC: 0 % (ref 0.0–0.2)

## 2019-09-25 LAB — RPR: RPR Ser Ql: NONREACTIVE

## 2019-09-25 MED ORDER — IBUPROFEN 600 MG PO TABS
600.0000 mg | ORAL_TABLET | Freq: Four times a day (QID) | ORAL | 0 refills | Status: DC
Start: 2019-09-25 — End: 2021-11-16

## 2019-09-25 MED ORDER — INFLUENZA VAC SPLIT QUAD 0.5 ML IM SUSY: 0.5000 mL | PREFILLED_SYRINGE | INTRAMUSCULAR | Status: DC

## 2019-09-25 NOTE — Progress Notes (Signed)
Post Partum Day # 1, s/p SVD  Language Line Interpreter used for this encounter    Subjective: no complaints, up ad lib, voiding and tolerating PO  Objective: Temp:  [98 F (36.7 C)-98.9 F (37.2 C)] 98.6 F (37 C) (09/24 0733) Pulse Rate:  [66-83] 66 (09/24 0733) Resp:  [16-20] 18 (09/24 0733) BP: (99-111)/(64-77) 111/77 (09/24 0733) SpO2:  [98 %-100 %] 100 % (09/24 0733)  Physical Exam:  General: alert and no distress  Lungs: clear to auscultation bilaterally Breasts: normal appearance, no masses or tenderness Heart: regular rate and rhythm, S1, S2 normal, no murmur, click, rub or gallop Abdomen: soft, non-tender; bowel sounds normal; no masses,  no organomegaly Pelvis: Lochia: appropriate, Uterine Fundus: firm Extremities: DVT Evaluation: No evidence of DVT seen on physical exam. Negative Homan's sign. No cords or calf tenderness. No significant calf/ankle edema.  Recent Labs    09/24/19 0340 09/25/19 0527  HGB 13.0 11.9*  HCT 38.2 35.4*    Assessment/Plan: Doing well postpartum  Formula feeding  Contraception: undecided. To discuss with her OB provider at her postpartum visit  Declines circumcision Dispo: can d/c home today or tomorrow pending newborn status Patient will follow with her provider at [facility]     LOS: 1 day   Hildred Laser, MD [facility]

## 2019-09-25 NOTE — Discharge Summary (Signed)
Postpartum Discharge Summary     Patient Name: Stacey Wilcox DOB: 1995-10-02 MRN: [id number]  Date of admission: 09/24/2019 Delivery date:09/24/2019  Delivering provider: Rubie Maid  Date of discharge: 09/25/2019  Admitting diagnosis: Labor and delivery indication for care or intervention [O75.9] Intrauterine pregnancy: [redacted]w[redacted]d    Secondary diagnosis:  Active Problems:   Indication for care or intervention in labor or delivery   Labor and delivery indication for care or intervention  Additional problems: None   Discharge diagnosis: Term Pregnancy Delivered                                              Post partum procedures:None Augmentation: AROM and Pitocin Complications: None  Hospital course: Onset of Labor With Vaginal Delivery      24y.o. yo G1P1001 at 458w6das admitted in Active Labor on 09/24/2019. Patient had an uncomplicated labor course as follows:  Membrane Rupture Time/Date: 8:01 AM ,09/24/2019   Delivery Method:Vaginal, Spontaneous  Episiotomy: None  Lacerations:  2nd degree  Patient had an uncomplicated postpartum course.  She is ambulating, tolerating a regular diet, passing flatus, and urinating well. Patient is discharged home in stable condition on 09/27/19.  Newborn Data: Birth date:09/24/2019  Birth time:10:01 AM  Gender:Female  Living status:Living  Apgars:9 ,9  Weight:2690 g   Magnesium Sulfate received: No BMZ received: No Rhophylac:N/A MMR:No T-DaP:Given prenatally Flu: Yes Transfusion:No  Physical exam  Vitals:   09/24/19 1914 09/24/19 2302 09/25/19 0312 09/25/19 0733  BP: 110/67 99/69 107/64 111/77  Pulse: 83 73 68 66  Resp: _0 Temp: 98.4 F (36.9 C) 98 F (36.7 C) 98.3 F (36.8 C) 98.6 F (37 C)  TempSrc: Oral Oral Oral Oral  SpO2: 98% 98% 99% 100%  Weight:      Height:       General: alert Lochia: appropriate Uterine Fundus: firm Incision: N/A DVT Evaluation: No evidence of DVT seen on physical  exam. Negative Homan's sign. No cords or calf tenderness. No significant calf/ankle edema. Labs: Lab Results  Component Value Date   WBC 16.5 (H) 09/25/2019   HGB 11.9 (L) 09/25/2019   HCT 35.4 (L) 09/25/2019   MCV 86.8 09/25/2019   PLT 236 09/25/2019   No flowsheet data found. Edinburgh Score: Edinburgh Postnatal Depression Scale Screening Tool 09/24/2019  I have been able to laugh and see the funny side of things. 1  I have looked forward with enjoyment to things. 1  I have blamed myself unnecessarily when things went wrong. 0  I have been anxious or worried for no good reason. 0  I have felt scared or panicky for no good reason. 0  Things have been getting on top of me. 0  I have been so unhappy that I have had difficulty sleeping. 0  I have felt sad or miserable. 0  I have been so unhappy that I have been crying. 0  The thought of harming myself has occurred to me. 0  Edinburgh Postnatal Depression Scale Total 2      After visit meds:  Allergies as of 09/25/2019   No Known Allergies     Medication List    TAKE these medications   ibuprofen 600 MG tablet Commonly known as: ADVIL Take 1 tablet (600 mg total) by mouth every 6 (six) hours.  prenatal vitamin w/FE, FA 27-1 MG Tabs tablet Take 1 tablet by mouth daily at 12 noon.        Discharge home in stable condition Infant Feeding: Bottle Infant Disposition:home with mother Discharge instruction: per After Visit Summary and Postpartum booklet. Activity: Advance as tolerated. Pelvic rest for 6 weeks.  Diet: routine diet Anticipated Birth Control: Unsure Postpartum Appointment:6 weeks Additional Postpartum F/U: None Future Appointments:No future appointments. Follow up Visit:  [place], [facility]. Go in 6 week(s).   Contact information: [address] [place] [address] [phone]                   09/25/2019 Rubie Maid, MD

## 2019-09-25 NOTE — Progress Notes (Signed)
Patient discharged home with infant. Discharge instructions and prescriptions given and reviewed with patient with spanish interpreter. Patient verbalized understanding. Escorted out by auxillary.  

## 2019-09-25 NOTE — Anesthesia Postprocedure Evaluation (Signed)
Anesthesia Post Note  Patient: Stacey Wilcox  Procedure(s) Performed: AN AD HOC LABOR EPIDURAL  Patient location during evaluation: Mother Baby Anesthesia Type: Epidural Level of consciousness: awake and alert Pain management: pain level controlled Vital Signs Assessment: post-procedure vital signs reviewed and stable Respiratory status: spontaneous breathing, nonlabored ventilation and respiratory function stable Cardiovascular status: stable Postop Assessment: no headache, no backache and epidural receding Anesthetic complications: no   No complications documented.   Last Vitals:  Vitals:   09/25/19 0312 09/25/19 0733  BP: 107/64 111/77  Pulse: 68 66  Resp: 18 18  Temp: 36.8 C 37 C  SpO2: 99% 100%    Last Pain:  Vitals:   09/25/19 0930  TempSrc:   PainSc: 0-No pain                 Lister Brizzi Lawerance Cruel

## 2019-10-02 DIAGNOSIS — Z419 Encounter for procedure for purposes other than remedying health state, unspecified: Secondary | ICD-10-CM | POA: Diagnosis not present

## 2019-10-19 DIAGNOSIS — Z23 Encounter for immunization: Secondary | ICD-10-CM | POA: Diagnosis not present

## 2019-10-19 DIAGNOSIS — Z1389 Encounter for screening for other disorder: Secondary | ICD-10-CM | POA: Diagnosis not present

## 2019-10-19 DIAGNOSIS — Z7189 Other specified counseling: Secondary | ICD-10-CM | POA: Diagnosis not present

## 2019-11-02 DIAGNOSIS — Z419 Encounter for procedure for purposes other than remedying health state, unspecified: Secondary | ICD-10-CM | POA: Diagnosis not present

## 2019-11-05 ENCOUNTER — Encounter: Payer: Self-pay | Admitting: Obstetrics and Gynecology

## 2019-11-09 DIAGNOSIS — Z130 Encounter for screening for diseases of the blood and blood-forming organs and certain disorders involving the immune mechanism: Secondary | ICD-10-CM | POA: Diagnosis not present

## 2019-11-09 DIAGNOSIS — Z7189 Other specified counseling: Secondary | ICD-10-CM | POA: Diagnosis not present

## 2019-11-09 DIAGNOSIS — Z1389 Encounter for screening for other disorder: Secondary | ICD-10-CM | POA: Diagnosis not present

## 2019-11-09 DIAGNOSIS — Z23 Encounter for immunization: Secondary | ICD-10-CM | POA: Diagnosis not present

## 2019-11-09 DIAGNOSIS — R87612 Low grade squamous intraepithelial lesion on cytologic smear of cervix (LGSIL): Secondary | ICD-10-CM | POA: Diagnosis not present

## 2019-11-09 DIAGNOSIS — Z0289 Encounter for other administrative examinations: Secondary | ICD-10-CM | POA: Diagnosis not present

## 2019-11-18 DIAGNOSIS — Z1389 Encounter for screening for other disorder: Secondary | ICD-10-CM | POA: Diagnosis not present

## 2019-11-18 DIAGNOSIS — R591 Generalized enlarged lymph nodes: Secondary | ICD-10-CM | POA: Diagnosis not present

## 2019-11-30 DIAGNOSIS — R229 Localized swelling, mass and lump, unspecified: Secondary | ICD-10-CM | POA: Diagnosis not present

## 2019-11-30 DIAGNOSIS — R591 Generalized enlarged lymph nodes: Secondary | ICD-10-CM | POA: Diagnosis not present

## 2019-12-02 DIAGNOSIS — Z419 Encounter for procedure for purposes other than remedying health state, unspecified: Secondary | ICD-10-CM | POA: Diagnosis not present

## 2020-01-02 DIAGNOSIS — Z419 Encounter for procedure for purposes other than remedying health state, unspecified: Secondary | ICD-10-CM | POA: Diagnosis not present

## 2020-02-02 DIAGNOSIS — Z419 Encounter for procedure for purposes other than remedying health state, unspecified: Secondary | ICD-10-CM | POA: Diagnosis not present

## 2020-03-01 DIAGNOSIS — Z419 Encounter for procedure for purposes other than remedying health state, unspecified: Secondary | ICD-10-CM | POA: Diagnosis not present

## 2020-04-01 DIAGNOSIS — Z419 Encounter for procedure for purposes other than remedying health state, unspecified: Secondary | ICD-10-CM | POA: Diagnosis not present

## 2020-05-01 DIAGNOSIS — Z419 Encounter for procedure for purposes other than remedying health state, unspecified: Secondary | ICD-10-CM | POA: Diagnosis not present

## 2020-06-01 DIAGNOSIS — Z419 Encounter for procedure for purposes other than remedying health state, unspecified: Secondary | ICD-10-CM | POA: Diagnosis not present

## 2020-07-01 DIAGNOSIS — Z419 Encounter for procedure for purposes other than remedying health state, unspecified: Secondary | ICD-10-CM | POA: Diagnosis not present

## 2020-08-01 DIAGNOSIS — Z419 Encounter for procedure for purposes other than remedying health state, unspecified: Secondary | ICD-10-CM | POA: Diagnosis not present

## 2020-09-01 DIAGNOSIS — Z419 Encounter for procedure for purposes other than remedying health state, unspecified: Secondary | ICD-10-CM | POA: Diagnosis not present

## 2020-10-01 DIAGNOSIS — Z419 Encounter for procedure for purposes other than remedying health state, unspecified: Secondary | ICD-10-CM | POA: Diagnosis not present

## 2020-11-01 DIAGNOSIS — Z419 Encounter for procedure for purposes other than remedying health state, unspecified: Secondary | ICD-10-CM | POA: Diagnosis not present

## 2020-12-01 DIAGNOSIS — Z419 Encounter for procedure for purposes other than remedying health state, unspecified: Secondary | ICD-10-CM | POA: Diagnosis not present

## 2020-12-06 DIAGNOSIS — Z124 Encounter for screening for malignant neoplasm of cervix: Secondary | ICD-10-CM | POA: Diagnosis not present

## 2020-12-06 DIAGNOSIS — Z113 Encounter for screening for infections with a predominantly sexual mode of transmission: Secondary | ICD-10-CM | POA: Diagnosis not present

## 2020-12-06 DIAGNOSIS — G43909 Migraine, unspecified, not intractable, without status migrainosus: Secondary | ICD-10-CM | POA: Diagnosis not present

## 2020-12-06 DIAGNOSIS — Z0000 Encounter for general adult medical examination without abnormal findings: Secondary | ICD-10-CM | POA: Diagnosis not present

## 2020-12-06 DIAGNOSIS — R87610 Atypical squamous cells of undetermined significance on cytologic smear of cervix (ASC-US): Secondary | ICD-10-CM | POA: Diagnosis not present

## 2020-12-06 DIAGNOSIS — Z1389 Encounter for screening for other disorder: Secondary | ICD-10-CM | POA: Diagnosis not present

## 2020-12-06 DIAGNOSIS — Z01419 Encounter for gynecological examination (general) (routine) without abnormal findings: Secondary | ICD-10-CM | POA: Diagnosis not present

## 2021-01-01 DIAGNOSIS — Z419 Encounter for procedure for purposes other than remedying health state, unspecified: Secondary | ICD-10-CM | POA: Diagnosis not present

## 2021-01-01 NOTE — L&D Delivery Note (Signed)
Delivery Note  Date of delivery: 11/15/2021 Estimated Date of Delivery: 11/12/21 Patient's last menstrual period was 02/05/2021. EGA: [redacted]w[redacted]d  Delivery Note At 1:29 PM a viable female was delivered via Vaginal, Spontaneous (Presentation: Right Occiput Anterior).  APGAR: 9, 9; weight pending.  Placenta status: Spontaneous, Intact.  Cord: 3 vessels with the following complications: None.  Cord pH: n/a  First Stage: Labor onset: unknown Augmentation : Pitocin and AROM Analgesia /Anesthesia intrapartum: Epidural AROM at 0937  Stacey Wilcox presented to L&D with active labor. She was augmented with pitocin. Epidural placed for pain relief.   Second Stage: Complete dilation at 1317 Onset of pushing at 1325 FHR second stage Cat I, Cat II with imminent delivery Delivery at 1329 on 11/15/2021  She progressed to complete and had a spontaneous vaginal birth of a live female over an intact perineum. The fetal head was delivered in OA position with restitution to ROA. No nuchal cord. Anterior then posterior shoulders delivered spontaneously. Baby placed on mom's abdomen and attended to by transition RN. Cord clamped and cut after 1+ min  by FOB. Cord segment given to nurse for gases. Cord blood obtained for newborn labs.  Third Stage: Placenta delivered intact with 3VC at 1335 with trailing membranes Placenta disposition: routine disposal Uterine tone firm / bleeding min IV pitocin given for hemorrhage prophylaxis  Anesthesia: Epidural Episiotomy: None Lacerations: 2nd degree;Perineal Suture Repair: 2.0 vicryl Est. Blood Loss (mL):  21mL  Complications: none  Mom to postpartum.  Baby to Couplet care / Skin to Skin.  Newborn: Birth Weight: pending  Apgar Scores: 9; 9 Feeding planned: Breastfeeding   Cyril Mourning, CNM 11/15/2021 1:58 PM

## 2021-01-11 DIAGNOSIS — R87610 Atypical squamous cells of undetermined significance on cytologic smear of cervix (ASC-US): Secondary | ICD-10-CM | POA: Diagnosis not present

## 2021-01-11 DIAGNOSIS — Z23 Encounter for immunization: Secondary | ICD-10-CM | POA: Diagnosis not present

## 2021-02-01 DIAGNOSIS — Z419 Encounter for procedure for purposes other than remedying health state, unspecified: Secondary | ICD-10-CM | POA: Diagnosis not present

## 2021-03-01 DIAGNOSIS — Z419 Encounter for procedure for purposes other than remedying health state, unspecified: Secondary | ICD-10-CM | POA: Diagnosis not present

## 2021-03-21 DIAGNOSIS — Z0000 Encounter for general adult medical examination without abnormal findings: Secondary | ICD-10-CM | POA: Diagnosis not present

## 2021-03-21 DIAGNOSIS — Z3200 Encounter for pregnancy test, result unknown: Secondary | ICD-10-CM | POA: Diagnosis not present

## 2021-03-21 DIAGNOSIS — Z3201 Encounter for pregnancy test, result positive: Secondary | ICD-10-CM | POA: Diagnosis not present

## 2021-03-21 DIAGNOSIS — Z1389 Encounter for screening for other disorder: Secondary | ICD-10-CM | POA: Diagnosis not present

## 2021-04-01 DIAGNOSIS — Z419 Encounter for procedure for purposes other than remedying health state, unspecified: Secondary | ICD-10-CM | POA: Diagnosis not present

## 2021-04-29 ENCOUNTER — Emergency Department: Payer: Medicaid Other

## 2021-04-29 ENCOUNTER — Emergency Department
Admission: EM | Admit: 2021-04-29 | Discharge: 2021-04-30 | Disposition: A | Discharge status: Home / Self Care | Payer: Medicaid Other | Attending: Emergency Medicine | Admitting: Emergency Medicine

## 2021-04-29 ENCOUNTER — Other Ambulatory Visit: Payer: Self-pay

## 2021-04-29 ENCOUNTER — Encounter: Payer: Self-pay | Admitting: Emergency Medicine

## 2021-04-29 DIAGNOSIS — O99891 Other specified diseases and conditions complicating pregnancy: Secondary | ICD-10-CM | POA: Diagnosis not present

## 2021-04-29 DIAGNOSIS — M545 Low back pain, unspecified: Secondary | ICD-10-CM | POA: Insufficient documentation

## 2021-04-29 DIAGNOSIS — R1030 Lower abdominal pain, unspecified: Secondary | ICD-10-CM | POA: Diagnosis not present

## 2021-04-29 DIAGNOSIS — Z3A12 12 weeks gestation of pregnancy: Secondary | ICD-10-CM | POA: Insufficient documentation

## 2021-04-29 DIAGNOSIS — W010XXA Fall on same level from slipping, tripping and stumbling without subsequent striking against object, initial encounter: Secondary | ICD-10-CM | POA: Insufficient documentation

## 2021-04-29 DIAGNOSIS — O26891 Other specified pregnancy related conditions, first trimester: Secondary | ICD-10-CM | POA: Insufficient documentation

## 2021-04-29 DIAGNOSIS — Z3A11 11 weeks gestation of pregnancy: Secondary | ICD-10-CM | POA: Diagnosis not present

## 2021-04-29 DIAGNOSIS — Y9301 Activity, walking, marching and hiking: Secondary | ICD-10-CM | POA: Insufficient documentation

## 2021-04-29 DIAGNOSIS — W19XXXA Unspecified fall, initial encounter: Secondary | ICD-10-CM

## 2021-04-29 DIAGNOSIS — Z3201 Encounter for pregnancy test, result positive: Secondary | ICD-10-CM | POA: Diagnosis not present

## 2021-04-29 LAB — CBC WITH DIFFERENTIAL/PLATELET
Abs Immature Granulocytes: 0.06 10*3/uL (ref 0.00–0.07)
Basophils Absolute: 0 10*3/uL (ref 0.0–0.1)
Basophils Relative: 0 %
Eosinophils Absolute: 0.1 10*3/uL (ref 0.0–0.5)
Eosinophils Relative: 1 %
HCT: 36.9 % (ref 36.0–46.0)
Hemoglobin: 12.2 g/dL (ref 12.0–15.0)
Immature Granulocytes: 0 %
Lymphocytes Relative: 29 %
Lymphs Abs: 4 10*3/uL (ref 0.7–4.0)
MCH: 28.5 pg (ref 26.0–34.0)
MCHC: 33.1 g/dL (ref 30.0–36.0)
MCV: 86.2 fL (ref 80.0–100.0)
Monocytes Absolute: 0.8 10*3/uL (ref 0.1–1.0)
Monocytes Relative: 6 %
Neutro Abs: 8.8 10*3/uL — ABNORMAL HIGH (ref 1.7–7.7)
Neutrophils Relative %: 64 %
Platelets: 323 10*3/uL (ref 150–400)
RBC: 4.28 MIL/uL (ref 3.87–5.11)
RDW: 12.5 % (ref 11.5–15.5)
WBC: 13.8 10*3/uL — ABNORMAL HIGH (ref 4.0–10.5)
nRBC: 0 % (ref 0.0–0.2)

## 2021-04-29 LAB — URINALYSIS, COMPLETE (UACMP) WITH MICROSCOPIC
Bilirubin Urine: NEGATIVE
Glucose, UA: NEGATIVE mg/dL
Hgb urine dipstick: NEGATIVE
Ketones, ur: NEGATIVE mg/dL
Nitrite: NEGATIVE
Protein, ur: NEGATIVE mg/dL
Specific Gravity, Urine: 1.025 (ref 1.005–1.030)
WBC, UA: NONE SEEN WBC/hpf (ref 0–5)
pH: 6 (ref 5.0–8.0)

## 2021-04-29 LAB — COMPREHENSIVE METABOLIC PANEL
ALT: 12 U/L (ref 0–44)
AST: 15 U/L (ref 15–41)
Albumin: 3.9 g/dL (ref 3.5–5.0)
Alkaline Phosphatase: 67 U/L (ref 38–126)
Anion gap: 11 (ref 5–15)
BUN: 13 mg/dL (ref 6–20)
CO2: 22 mmol/L (ref 22–32)
Calcium: 9 mg/dL (ref 8.9–10.3)
Chloride: 105 mmol/L (ref 98–111)
Creatinine, Ser: 0.68 mg/dL (ref 0.44–1.00)
GFR, Estimated: >60 mL/min (ref >60)
Glucose, Bld: 98 mg/dL (ref 70–99)
Potassium: 3.6 mmol/L (ref 3.5–5.1)
Sodium: 138 mmol/L (ref 135–145)
Total Bilirubin: <0.1 mg/dL — ABNORMAL LOW (ref 0.3–1.2)
Total Protein: 7.3 g/dL (ref 6.5–8.1)

## 2021-04-29 LAB — HCG, QUANTITATIVE, PREGNANCY: hCG, Beta Chain, Quant, S: 78160 m[IU]/mL — ABNORMAL HIGH (ref <5)

## 2021-04-29 LAB — LIPASE, BLOOD: Lipase: 37 U/L (ref 11–51)

## 2021-04-29 LAB — POC URINE PREG, ED: Preg Test, Ur: POSITIVE — AB

## 2021-04-29 LAB — ABO/RH: ABO/RH(D): O POS

## 2021-04-29 NOTE — ED Triage Notes (Signed)
AMN interpretor used= [id number] ? ? ?Pt reports she slipped and fell landing on her back today.  Pt reports she is [redacted] weeks pregnant.  Pt has not yet been seen for any pregnancy care, reports they told her to call in May for an appointment.  LMP 02/05/21.  Denies vaginal bleeding.  Also c/o pain to lower pelvic/abdominal area.  G3P1.   ?

## 2021-04-29 NOTE — Discharge Instructions (Signed)
You can take TYLENOL/ACETAMINOPHEN for pain  ?

## 2021-04-29 NOTE — ED Provider Notes (Signed)
? ?[facility] ?Provider Note ? ? ? Event Date/Time  ? First MD Initiated Contact with Patient 04/29/21 2205   ?  (approximate) ? ? ?History  ? ?Fall ? ? ?HPI ? ?Stacey Wilcox is a 26 y.o. female here with fall and back and abdominal pain.  The patient is currently [redacted] weeks pregnant based on LMP.  She states that she slipped while walking today.  She fell onto her back and buttocks.  She reports aching, throbbing, lower back pain as well as mild suprapubic pain since then.  This all began after a fall.  She admits no urinary symptoms.  No vaginal bleeding or leakage of fluid.  Denies any lower extremity numbness or weakness.  No other complaints.  No history of prior back injuries.  She has had no complications with current pregnancy. ?  ? ? ?Physical Exam  ? ?Triage Vital Signs: ?ED Triage Vitals  ?Enc Vitals Group  ?   BP 04/29/21 2111 124/72  ?   Pulse Rate 04/29/21 2111 84  ?   Resp 04/29/21 2111 18  ?   Temp 04/29/21 2111 98.9 ?F (37.2 ?C)  ?   Temp Source 04/29/21 2111 Oral  ?   SpO2 04/29/21 2111 98 %  ?   Weight 04/29/21 2121 206 lb 12.7 oz (93.8 kg)  ?   Height 04/29/21 2119 5\' 4"  (1.626 m)  ?   Head Circumference --   ?   Peak Flow --   ?   Pain Score 04/29/21 2112 5  ?   Pain Loc --   ?   Pain Edu? --   ?   Excl. in GC? --   ? ? ?Most recent vital signs: ?Vitals:  ? 04/29/21 2111  ?BP: 124/72  ?Pulse: 84  ?Resp: 18  ?Temp: 98.9 ?F (37.2 ?C)  ?SpO2: 98%  ? ? ? ?General: Awake, no distress.  ?CV:  Good peripheral perfusion.  ?Resp:  Normal effort.  ?Abd:  No distention.  Minimal suprapubic tenderness.  No rebound or guarding.  No bruising. ?Other:  Minimal lower lumbar paraspinal tenderness.  Mild midline tenderness as well.  Strength out of 5 bilateral lower extremity.  Normal sensation light touch. ? ? ?ED Results / Procedures / Treatments  ? ?Labs ?(all labs ordered are listed, but only abnormal results are displayed) ?Labs Reviewed  ?CBC WITH DIFFERENTIAL/PLATELET -  Abnormal; Notable for the following components:  ?    Result Value  ? WBC 13.8 (*)   ? Neutro Abs 8.8 (*)   ? All other components within normal limits  ?COMPREHENSIVE METABOLIC PANEL - Abnormal; Notable for the following components:  ? Total Bilirubin <0.1 (*)   ? All other components within normal limits  ?HCG, QUANTITATIVE, PREGNANCY - Abnormal; Notable for the following components:  ? hCG, Beta Chain, Quant, S 78,160 (*)   ? All other components within normal limits  ?URINALYSIS, COMPLETE (UACMP) WITH MICROSCOPIC - Abnormal; Notable for the following components:  ? Color, Urine YELLOW (*)   ? APPearance CLOUDY (*)   ? Leukocytes,Ua TRACE (*)   ? Bacteria, UA RARE (*)   ? All other components within normal limits  ?POC URINE PREG, ED - Abnormal; Notable for the following components:  ? Preg Test, Ur POSITIVE (*)   ? All other components within normal limits  ?URINE CULTURE  ?LIPASE, BLOOD  ?ABO/RH  ? ? ? ?EKG ? ? ? ?RADIOLOGY ?Ultrasound: Pending ? ? ?I  also independently reviewed and agree with radiologist interpretations. ? ? ?PROCEDURES: ? ?Critical Care performed: No ? ? ?MEDICATIONS ORDERED IN ED: ?Medications - No data to display ? ? ?IMPRESSION / MDM / ASSESSMENT AND PLAN / ED COURSE  ?I reviewed the triage vital signs and the nursing notes. ?             ?               ? ? ?MDM:  ?26 year old female currently [redacted] weeks pregnant here with lower back pain and mild abdominal pain after fall from standing.  Regarding her back pain, she has fully intact distal strength and sensation of her lower extremities.  No red flags to suggest occult cord injury or cauda equina syndrome.  She does have some mild midline tenderness so will obtain plain films.  Feel the risks of a missed spinal injury outweigh the risks of minimal radiation at this time.  Otherwise, will obtain an ultrasound given her mild abdominal tenderness though her exam is also very reassuring.  Screening lab work obtained, unremarkable.  CBC with no  anemia.  CMP with normal renal function and LFTs.  UPT positive.  UA unremarkable, do not suspect symptomatic bacteriuria. ? ?Plan to follow-up imaging, likely discharge with Tylenol as needed. ? ?MEDICATIONS GIVEN IN ED: ?Medications - No data to display ? ? ?Consults:  ?None ? ? ?EMR reviewed  ?Reviewed [facility] notes from January 2023 ? ? ? ? ?FINAL CLINICAL IMPRESSION(S) / ED DIAGNOSES  ? ?Final diagnoses:  ?Fall, initial encounter  ?Acute midline low back pain without sciatica  ? ? ? ?Rx / DC Orders  ? ?ED Discharge Orders   ? ? None  ? ?  ? ? ? ?Note:  This document was prepared using Dragon voice recognition software and may include unintentional dictation errors. ?  ?Shaune Pollack, MD ?04/29/21 2326 ? ?

## 2021-05-01 DIAGNOSIS — Z419 Encounter for procedure for purposes other than remedying health state, unspecified: Secondary | ICD-10-CM | POA: Diagnosis not present

## 2021-05-01 LAB — URINE CULTURE: Culture: NO GROWTH

## 2021-05-23 DIAGNOSIS — Z3482 Encounter for supervision of other normal pregnancy, second trimester: Secondary | ICD-10-CM | POA: Diagnosis not present

## 2021-05-23 DIAGNOSIS — F331 Major depressive disorder, recurrent, moderate: Secondary | ICD-10-CM | POA: Diagnosis not present

## 2021-05-30 DIAGNOSIS — Z1389 Encounter for screening for other disorder: Secondary | ICD-10-CM | POA: Diagnosis not present

## 2021-05-30 DIAGNOSIS — O10119 Pre-existing hypertensive heart disease complicating pregnancy, unspecified trimester: Secondary | ICD-10-CM | POA: Diagnosis not present

## 2021-06-01 DIAGNOSIS — Z419 Encounter for procedure for purposes other than remedying health state, unspecified: Secondary | ICD-10-CM | POA: Diagnosis not present

## 2021-06-22 DIAGNOSIS — Z3A19 19 weeks gestation of pregnancy: Secondary | ICD-10-CM | POA: Diagnosis not present

## 2021-06-22 DIAGNOSIS — O99212 Obesity complicating pregnancy, second trimester: Secondary | ICD-10-CM | POA: Diagnosis not present

## 2021-06-22 DIAGNOSIS — Z3689 Encounter for other specified antenatal screening: Secondary | ICD-10-CM | POA: Diagnosis not present

## 2021-06-28 DIAGNOSIS — Z3482 Encounter for supervision of other normal pregnancy, second trimester: Secondary | ICD-10-CM | POA: Diagnosis not present

## 2021-07-01 DIAGNOSIS — Z419 Encounter for procedure for purposes other than remedying health state, unspecified: Secondary | ICD-10-CM | POA: Diagnosis not present

## 2021-07-25 DIAGNOSIS — Z3A24 24 weeks gestation of pregnancy: Secondary | ICD-10-CM | POA: Diagnosis not present

## 2021-07-25 DIAGNOSIS — Z362 Encounter for other antenatal screening follow-up: Secondary | ICD-10-CM | POA: Diagnosis not present

## 2021-07-26 DIAGNOSIS — O10119 Pre-existing hypertensive heart disease complicating pregnancy, unspecified trimester: Secondary | ICD-10-CM | POA: Diagnosis not present

## 2021-07-26 DIAGNOSIS — Z23 Encounter for immunization: Secondary | ICD-10-CM | POA: Diagnosis not present

## 2021-07-26 DIAGNOSIS — E785 Hyperlipidemia, unspecified: Secondary | ICD-10-CM | POA: Diagnosis not present

## 2021-07-26 DIAGNOSIS — Z3482 Encounter for supervision of other normal pregnancy, second trimester: Secondary | ICD-10-CM | POA: Diagnosis not present

## 2021-08-01 DIAGNOSIS — Z419 Encounter for procedure for purposes other than remedying health state, unspecified: Secondary | ICD-10-CM | POA: Diagnosis not present

## 2021-08-24 DIAGNOSIS — Z3482 Encounter for supervision of other normal pregnancy, second trimester: Secondary | ICD-10-CM | POA: Diagnosis not present

## 2021-09-01 DIAGNOSIS — Z419 Encounter for procedure for purposes other than remedying health state, unspecified: Secondary | ICD-10-CM | POA: Diagnosis not present

## 2021-09-26 DIAGNOSIS — Z3483 Encounter for supervision of other normal pregnancy, third trimester: Secondary | ICD-10-CM | POA: Diagnosis not present

## 2021-10-01 DIAGNOSIS — Z419 Encounter for procedure for purposes other than remedying health state, unspecified: Secondary | ICD-10-CM | POA: Diagnosis not present

## 2021-10-11 DIAGNOSIS — Z3483 Encounter for supervision of other normal pregnancy, third trimester: Secondary | ICD-10-CM | POA: Diagnosis not present

## 2021-10-24 DIAGNOSIS — Z3483 Encounter for supervision of other normal pregnancy, third trimester: Secondary | ICD-10-CM | POA: Diagnosis not present

## 2021-11-01 DIAGNOSIS — Z419 Encounter for procedure for purposes other than remedying health state, unspecified: Secondary | ICD-10-CM | POA: Diagnosis not present

## 2021-11-01 DIAGNOSIS — Z3483 Encounter for supervision of other normal pregnancy, third trimester: Secondary | ICD-10-CM | POA: Diagnosis not present

## 2021-11-07 DIAGNOSIS — Z3483 Encounter for supervision of other normal pregnancy, third trimester: Secondary | ICD-10-CM | POA: Diagnosis not present

## 2021-11-14 ENCOUNTER — Other Ambulatory Visit: Payer: Self-pay | Admitting: Certified Nurse Midwife

## 2021-11-14 DIAGNOSIS — Z3490 Encounter for supervision of normal pregnancy, unspecified, unspecified trimester: Secondary | ICD-10-CM

## 2021-11-14 NOTE — Progress Notes (Signed)
G2P1001 at [redacted]w[redacted]d, dated by 11wk [place]. Scheduled for induction of labor for post-dates on 11/19/21.   Prenatal provider: Phineas Real Pregnancy complicated by: Obesity Hx of gHTN Depression   Prenatal Labs: Blood type/Rh O pos  Antibody screen neg  Rubella Immune  Varicella Immune  RPR NR  HBsAg Neg  HIV NR  GC neg  Chlamydia neg  Genetic screening none  1 hour GTT 114  3 hour GTT N/a  GBS Negative   Tdap: 07/26/21 Flu: declined Contraception: undecided Feeding preference: undecided  ____ Janyce Llanos, CNM  Certified Nurse Midwife [facility]

## 2021-11-15 ENCOUNTER — Inpatient Hospital Stay
Admission: EM | Admit: 2021-11-15 | Discharge: 2021-11-16 | DRG: 806 | Disposition: A | Discharge status: Home / Self Care | Payer: Medicaid Other | Attending: Certified Nurse Midwife | Admitting: Certified Nurse Midwife

## 2021-11-15 ENCOUNTER — Other Ambulatory Visit: Payer: Self-pay

## 2021-11-15 ENCOUNTER — Inpatient Hospital Stay: Payer: Medicaid Other | Admitting: Anesthesiology

## 2021-11-15 DIAGNOSIS — D62 Acute posthemorrhagic anemia: Secondary | ICD-10-CM | POA: Diagnosis not present

## 2021-11-15 DIAGNOSIS — Z3490 Encounter for supervision of normal pregnancy, unspecified, unspecified trimester: Secondary | ICD-10-CM | POA: Diagnosis present

## 2021-11-15 DIAGNOSIS — O9081 Anemia of the puerperium: Secondary | ICD-10-CM | POA: Diagnosis not present

## 2021-11-15 DIAGNOSIS — Z3A40 40 weeks gestation of pregnancy: Secondary | ICD-10-CM

## 2021-11-15 DIAGNOSIS — O99214 Obesity complicating childbirth: Secondary | ICD-10-CM | POA: Diagnosis not present

## 2021-11-15 DIAGNOSIS — O480 Post-term pregnancy: Principal | ICD-10-CM | POA: Diagnosis present

## 2021-11-15 LAB — CBC
HCT: 39.3 % (ref 36.0–46.0)
Hemoglobin: 13.5 g/dL (ref 12.0–15.0)
MCH: 28.7 pg (ref 26.0–34.0)
MCHC: 34.4 g/dL (ref 30.0–36.0)
MCV: 83.6 fL (ref 80.0–100.0)
Platelets: 282 10*3/uL (ref 150–400)
RBC: 4.7 MIL/uL (ref 3.87–5.11)
RDW: 14.2 % (ref 11.5–15.5)
WBC: 13 10*3/uL — ABNORMAL HIGH (ref 4.0–10.5)
nRBC: 0 % (ref 0.0–0.2)

## 2021-11-15 LAB — COMPREHENSIVE METABOLIC PANEL
ALT: 9 U/L (ref 0–44)
AST: 23 U/L (ref 15–41)
Albumin: 3.3 g/dL — ABNORMAL LOW (ref 3.5–5.0)
Alkaline Phosphatase: 164 U/L — ABNORMAL HIGH (ref 38–126)
Anion gap: 11 (ref 5–15)
BUN: 12 mg/dL (ref 6–20)
CO2: 18 mmol/L — ABNORMAL LOW (ref 22–32)
Calcium: 9.5 mg/dL (ref 8.9–10.3)
Chloride: 108 mmol/L (ref 98–111)
Creatinine, Ser: 0.72 mg/dL (ref 0.44–1.00)
GFR, Estimated: >60 mL/min (ref >60)
Glucose, Bld: 84 mg/dL (ref 70–99)
Potassium: 3.9 mmol/L (ref 3.5–5.1)
Sodium: 137 mmol/L (ref 135–145)
Total Bilirubin: 0.4 mg/dL (ref 0.3–1.2)
Total Protein: 7.2 g/dL (ref 6.5–8.1)

## 2021-11-15 LAB — PROTEIN / CREATININE RATIO, URINE
Creatinine, Urine: 220 mg/dL
Protein Creatinine Ratio: 0.76 mg/mg{Cre} — ABNORMAL HIGH (ref 0.00–0.15)
Total Protein, Urine: 168 mg/dL

## 2021-11-15 LAB — RPR: RPR Ser Ql: NONREACTIVE

## 2021-11-15 MED ORDER — EPHEDRINE 5 MG/ML INJ: 10.0000 mg | INTRAVENOUS | Status: DC | PRN

## 2021-11-15 MED ORDER — FENTANYL-BUPIVACAINE-NACL 0.5-0.125-0.9 MG/250ML-% EP SOLN
EPIDURAL | Status: AC
  Filled 2021-11-15: qty 250

## 2021-11-15 MED ORDER — LABETALOL HCL 5 MG/ML IV SOLN: 80.0000 mg | INTRAVENOUS | Status: DC | PRN

## 2021-11-15 MED ORDER — ONDANSETRON HCL 4 MG/2ML IJ SOLN: 4.0000 mg | Freq: Four times a day (QID) | INTRAMUSCULAR | Status: DC | PRN

## 2021-11-15 MED ORDER — PHENYLEPHRINE 80 MCG/ML (10ML) SYRINGE FOR IV PUSH (FOR BLOOD PRESSURE SUPPORT): 80.0000 ug | PREFILLED_SYRINGE | INTRAVENOUS | Status: DC | PRN

## 2021-11-15 MED ORDER — COCONUT OIL OIL: 1.0000 | TOPICAL_OIL | Status: DC | PRN

## 2021-11-15 MED ORDER — TERBUTALINE SULFATE 1 MG/ML IJ SOLN: 0.2500 mg | Freq: Once | INTRAMUSCULAR | Status: DC | PRN

## 2021-11-15 MED ORDER — MISOPROSTOL 25 MCG QUARTER TABLET: 25.0000 ug | ORAL_TABLET | Freq: Once | ORAL | Status: DC

## 2021-11-15 MED ORDER — OXYCODONE HCL 5 MG PO TABS: 10.0000 mg | ORAL_TABLET | ORAL | Status: DC | PRN

## 2021-11-15 MED ORDER — DOCUSATE SODIUM 100 MG PO CAPS
100.0000 mg | ORAL_CAPSULE | Freq: Two times a day (BID) | ORAL | Status: DC
  Administered 2021-11-16: 100 mg via ORAL
  Filled 2021-11-15: qty 1

## 2021-11-15 MED ORDER — IBUPROFEN 600 MG PO TABS
600.0000 mg | ORAL_TABLET | Freq: Four times a day (QID) | ORAL | Status: DC
  Administered 2021-11-15 – 2021-11-16 (×4): 600 mg via ORAL
  Filled 2021-11-15 (×4): qty 1

## 2021-11-15 MED ORDER — MISOPROSTOL 200 MCG PO TABS
ORAL_TABLET | ORAL | Status: AC
  Filled 2021-11-15: qty 4

## 2021-11-15 MED ORDER — ACETAMINOPHEN 325 MG PO TABS
650.0000 mg | ORAL_TABLET | ORAL | Status: DC | PRN
  Administered 2021-11-15: 650 mg via ORAL
  Filled 2021-11-15 (×2): qty 2

## 2021-11-15 MED ORDER — BUPIVACAINE HCL (PF) 0.25 % IJ SOLN
INTRAMUSCULAR | Status: DC | PRN
  Administered 2021-11-15: 3 mL via EPIDURAL
  Administered 2021-11-15: 4 mL via EPIDURAL

## 2021-11-15 MED ORDER — MISOPROSTOL 25 MCG QUARTER TABLET: 25.0000 ug | ORAL_TABLET | ORAL | Status: DC | PRN

## 2021-11-15 MED ORDER — LACTATED RINGERS IV SOLN: 500.0000 mL | INTRAVENOUS | Status: DC | PRN

## 2021-11-15 MED ORDER — OXYTOCIN-SODIUM CHLORIDE 30-0.9 UT/500ML-% IV SOLN
INTRAVENOUS | Status: AC
  Filled 2021-11-15: qty 500

## 2021-11-15 MED ORDER — OXYTOCIN-SODIUM CHLORIDE 30-0.9 UT/500ML-% IV SOLN
2.5000 [IU]/h | INTRAVENOUS | Status: DC
  Administered 2021-11-15: 2.5 [IU]/h via INTRAVENOUS

## 2021-11-15 MED ORDER — LIDOCAINE-EPINEPHRINE (PF) 1.5 %-1:200000 IJ SOLN
INTRAMUSCULAR | Status: DC | PRN
  Administered 2021-11-15: 3 mL via PERINEURAL

## 2021-11-15 MED ORDER — HYDRALAZINE HCL 20 MG/ML IJ SOLN: 10.0000 mg | INTRAMUSCULAR | Status: DC | PRN

## 2021-11-15 MED ORDER — FENTANYL-BUPIVACAINE-NACL 0.5-0.125-0.9 MG/250ML-% EP SOLN: 12.0000 mL/h | EPIDURAL | Status: DC | PRN

## 2021-11-15 MED ORDER — ONDANSETRON HCL 4 MG PO TABS: 4.0000 mg | ORAL_TABLET | ORAL | Status: DC | PRN

## 2021-11-15 MED ORDER — FENTANYL CITRATE (PF) 100 MCG/2ML IJ SOLN: 50.0000 ug | INTRAMUSCULAR | Status: DC | PRN

## 2021-11-15 MED ORDER — BENZOCAINE-MENTHOL 20-0.5 % EX AERO
1.0000 | INHALATION_SPRAY | CUTANEOUS | Status: DC | PRN
  Administered 2021-11-15: 1 via TOPICAL
  Filled 2021-11-15 (×2): qty 56

## 2021-11-15 MED ORDER — WITCH HAZEL-GLYCERIN EX PADS
1.0000 | MEDICATED_PAD | CUTANEOUS | Status: DC | PRN
  Administered 2021-11-15: 1 via TOPICAL
  Filled 2021-11-15: qty 100

## 2021-11-15 MED ORDER — OXYTOCIN-SODIUM CHLORIDE 30-0.9 UT/500ML-% IV SOLN: 1.0000 m[IU]/min | INTRAVENOUS | Status: DC

## 2021-11-15 MED ORDER — LABETALOL HCL 5 MG/ML IV SOLN: 20.0000 mg | INTRAVENOUS | Status: DC | PRN

## 2021-11-15 MED ORDER — ACETAMINOPHEN 325 MG PO TABS: 650.0000 mg | ORAL_TABLET | ORAL | Status: DC | PRN

## 2021-11-15 MED ORDER — OXYCODONE HCL 5 MG PO TABS: 5.0000 mg | ORAL_TABLET | ORAL | Status: DC | PRN

## 2021-11-15 MED ORDER — SOD CITRATE-CITRIC ACID 500-334 MG/5ML PO SOLN: 30.0000 mL | ORAL | Status: DC | PRN

## 2021-11-15 MED ORDER — AMMONIA AROMATIC IN INHA
RESPIRATORY_TRACT | Status: AC
  Filled 2021-11-15: qty 10

## 2021-11-15 MED ORDER — TETANUS-DIPHTH-ACELL PERTUSSIS 5-2.5-18.5 LF-MCG/0.5 IM SUSY: 0.5000 mL | PREFILLED_SYRINGE | Freq: Once | INTRAMUSCULAR | Status: DC

## 2021-11-15 MED ORDER — DIBUCAINE (PERIANAL) 1 % EX OINT
1.0000 | TOPICAL_OINTMENT | CUTANEOUS | Status: DC | PRN
  Administered 2021-11-15: 1 via RECTAL
  Filled 2021-11-15: qty 28

## 2021-11-15 MED ORDER — OXYTOCIN-SODIUM CHLORIDE 30-0.9 UT/500ML-% IV SOLN
1.0000 m[IU]/min | INTRAVENOUS | Status: DC
  Administered 2021-11-15: 2 m[IU]/min via INTRAVENOUS

## 2021-11-15 MED ORDER — LIDOCAINE HCL (PF) 1 % IJ SOLN
INTRAMUSCULAR | Status: AC
  Filled 2021-11-15: qty 30

## 2021-11-15 MED ORDER — ONDANSETRON HCL 4 MG/2ML IJ SOLN: 4.0000 mg | INTRAMUSCULAR | Status: DC | PRN

## 2021-11-15 MED ORDER — FENTANYL-BUPIVACAINE-NACL 0.5-0.125-0.9 MG/250ML-% EP SOLN
EPIDURAL | Status: DC | PRN
  Administered 2021-11-15: 12 mL/h via EPIDURAL

## 2021-11-15 MED ORDER — OXYTOCIN BOLUS FROM INFUSION
333.0000 mL | Freq: Once | INTRAVENOUS | Status: AC
  Administered 2021-11-15: 333 mL via INTRAVENOUS

## 2021-11-15 MED ORDER — SIMETHICONE 80 MG PO CHEW: 80.0000 mg | CHEWABLE_TABLET | ORAL | Status: DC | PRN

## 2021-11-15 MED ORDER — DIPHENHYDRAMINE HCL 50 MG/ML IJ SOLN: 12.5000 mg | INTRAMUSCULAR | Status: DC | PRN

## 2021-11-15 MED ORDER — DIPHENHYDRAMINE HCL 25 MG PO CAPS: 25.0000 mg | ORAL_CAPSULE | Freq: Four times a day (QID) | ORAL | Status: DC | PRN

## 2021-11-15 MED ORDER — PRENATAL MULTIVITAMIN CH
1.0000 | ORAL_TABLET | Freq: Every day | ORAL | Status: DC
  Administered 2021-11-16: 1 via ORAL
  Filled 2021-11-15: qty 1

## 2021-11-15 MED ORDER — OXYTOCIN 10 UNIT/ML IJ SOLN
INTRAMUSCULAR | Status: AC
  Filled 2021-11-15: qty 2

## 2021-11-15 MED ORDER — LACTATED RINGERS IV SOLN: INTRAVENOUS | Status: DC

## 2021-11-15 MED ORDER — LIDOCAINE HCL (PF) 1 % IJ SOLN
INTRAMUSCULAR | Status: DC | PRN
  Administered 2021-11-15: 3 mL

## 2021-11-15 MED ORDER — LABETALOL HCL 5 MG/ML IV SOLN: 40.0000 mg | INTRAVENOUS | Status: DC | PRN

## 2021-11-15 MED ORDER — LACTATED RINGERS IV SOLN
500.0000 mL | Freq: Once | INTRAVENOUS | Status: AC
  Administered 2021-11-15: 500 mL via INTRAVENOUS

## 2021-11-15 MED ORDER — LIDOCAINE HCL (PF) 1 % IJ SOLN: 30.0000 mL | INTRAMUSCULAR | Status: DC | PRN

## 2021-11-15 NOTE — Progress Notes (Signed)
Labor Progress Note  Stacey Wilcox is a 26 y.o. G3P1001 at [redacted]w[redacted]d by ultrasound admitted for active labor  Subjective: Pt is comfortable with epidural  Objective: BP 132/81   Pulse 75   Temp 97.9 F (36.6 C) (Oral)   Resp 18   Ht 5\' 4"  (1.626 m)   Wt 107.5 kg   LMP 02/05/2021   SpO2 99%   BMI 40.68 kg/m   Fetal Assessment: FHT:  FHR: 120 bpm, variability: moderate,  accelerations:  Present,  decelerations:  Present occasional earlies Category/reactivity:  Category I UC:   regular, every 2-7 minutes SVE:    Dilation: 6cm  Effacement: 100%  Station:  -1  Consistency: soft  Position: anterior  Membrane status:possible AROM 0937 Amniotic color: unknown  Labs: Lab Results  Component Value Date   WBC 13.0 (H) 11/15/2021   HGB 13.5 11/15/2021   HCT 39.3 11/15/2021   MCV 83.6 11/15/2021   PLT 282 11/15/2021    Assessment / Plan: Protracted active phase Started Pit at 1005 Currently at 2 mU  Labor: Progressing on Pitocin, will continue to increase then try to AROM again Preeclampsia:   Vitals:   11/15/21 0820 11/15/21 0834 11/15/21 0904 11/15/21 0910  BP: 126/84 122/81 (!) 140/90 (!) 108/92   11/15/21 0913 11/15/21 0917 11/15/21 0922 11/15/21 0927  BP: 127/75 133/69 131/73 (!) 117/59   11/15/21 0932 11/15/21 0945 11/15/21 1002 11/15/21 1031  BP: 126/70 126/74 132/81 133/75  PCR  760 all other labs WNL  Fetal Wellbeing:  Category I Pain Control:  Epidural I/D:   Afebrile, GBS neg, AROM x 30 min Anticipated MOD:  NSVD  Jenifer E Bayley Hurn, CNM 11/15/2021, 10:35 AM

## 2021-11-15 NOTE — Anesthesia Preprocedure Evaluation (Signed)
Anesthesia Evaluation  Patient identified by MRN, date of birth, ID band Patient awake    Reviewed: Allergy & Precautions, H&P , NPO status , Patient's Chart, lab work & pertinent test results  Airway Mallampati: II       Dental no notable dental hx.    Pulmonary neg pulmonary ROS   Pulmonary exam normal        Cardiovascular negative cardio ROS Normal cardiovascular exam     Neuro/Psych  Headaches  negative psych ROS   GI/Hepatic Neg liver ROS,GERD  Controlled,,  Endo/Other  negative endocrine ROS    Renal/GU negative Renal ROS  negative genitourinary   Musculoskeletal   Abdominal   Peds  Hematology negative hematology ROS (+)   Anesthesia Other Findings   Reproductive/Obstetrics (+) Pregnancy                             Anesthesia Physical Anesthesia Plan  ASA: 2  Anesthesia Plan: Epidural   Post-op Pain Management:    Induction:   PONV Risk Score and Plan:   Airway Management Planned:   Additional Equipment:   Intra-op Plan:   Post-operative Plan:   Informed Consent: I have reviewed the patients History and Physical, chart, labs and discussed the procedure including the risks, benefits and alternatives for the proposed anesthesia with the patient or authorized representative who has indicated his/her understanding and acceptance.       Plan Discussed with: Anesthesiologist and CRNA  Anesthesia Plan Comments: (All history and consent obtained with the help of interpreter. )        Anesthesia Quick Evaluation

## 2021-11-15 NOTE — OB Triage Note (Signed)
Patient is a 26 yo, G3P1, at 40 weeks 3 days. Patient presents with complaints of contractions every 2-3 minutes that started around 1400 yesterday evening and have increased in frequency and intensity, rating them 9/10 now.  Patient denies any vaginal bleeding or LOF. Patient reports +FM. Monitors applied and assessing. BP slightly elevated, urine sent for PC ratio. Patient denies any vision changes, epigastric pain, or headache. All other VS WNL. Initial fetal heart tone 135. Wilson CNM notified of patients arrival to unit and reported to bedside, SVE 6cm/vtx. Plan to transfer to [facility] for admission and anticipate vaginal delivery.

## 2021-11-15 NOTE — Anesthesia Procedure Notes (Signed)
Epidural Patient location during procedure: OB Start time: 11/15/2021 8:53 AM End time: 11/15/2021 9:01 AM  Staffing Anesthesiologist: Piscitello, Cleda Mccreedy, MD Resident/CRNA: Hezzie Bump, CRNA Performed: resident/CRNA   Preanesthetic Checklist Completed: patient identified, IV checked, site marked, risks and benefits discussed, surgical consent, monitors and equipment checked, pre-op evaluation and timeout performed  Epidural Patient position: sitting Prep: ChloraPrep Patient monitoring: heart rate, continuous pulse ox and blood pressure Approach: midline Location: L3-L4 Injection technique: LOR saline  Needle:  Needle type: Tuohy  Needle gauge: 17 G Needle length: 9 cm and 9 Needle insertion depth: 7 cm Catheter type: closed end flexible Catheter size: 19 Gauge Catheter at skin depth: 12 cm Test dose: negative and 1.5% lidocaine with Epi 1:200 K  Assessment Sensory level: T10 Events: blood not aspirated, injection not painful, no injection resistance, no paresthesia and negative IV test  Additional Notes 2 attempt Pt. Evaluated and documentation done after procedure finished. Patient identified. Risks/Benefits/Options discussed with patient including but not limited to bleeding, infection, nerve damage, paralysis, failed block, incomplete pain control, headache, blood pressure changes, nausea, vomiting, reactions to medication both or allergic, itching and postpartum back pain. Confirmed with bedside nurse the patient's most recent platelet count. Confirmed with patient that they are not currently taking any anticoagulation, have any bleeding history or any family history of bleeding disorders. Patient expressed understanding and wished to proceed. All questions were answered. Sterile technique was used throughout the entire procedure. Please see nursing notes for vital signs. Test dose was given through epidural catheter and negative prior to continuing to dose epidural  or start infusion. Warning signs of high block given to the patient including shortness of breath, tingling/numbness in hands, complete motor block, or any concerning symptoms with instructions to call for help. Patient was given instructions on fall risk and not to get out of bed. All questions and concerns addressed with instructions to call with any issues or inadequate analgesia.    Patient tolerated the insertion well without immediate complications.Reason for block:procedure for pain

## 2021-11-15 NOTE — Lactation Note (Signed)
This note was copied from a baby's chart. Lactation Consultation Note  Patient Name: Stacey Wilcox [id number] Date: 11/15/2021 Reason for consult: Initial assessment;Term;RN request;Breastfeeding assistance Age:26 hours  Maternal Data Has patient been taught Hand Expression?: Yes Does the patient have breastfeeding experience prior to this delivery?: Yes How long did the patient breastfeed?: 6 months  P2, SVD 3 hours ago. Experienced breastfeeding mom, desires Br/Bo feeding.  Feeding Mother's Current Feeding Choice: Breast Milk and Formula  Baby has fed 2x since delivery. At this feeding attempt mom notes pain/discomfort with baby at the breast. LC notes baby's position at the breast may be causing the damage. Baby tummy up, head turned without body turned.  LATCH Score Latch: Grasps breast easily, tongue down, lips flanged, rhythmical sucking.  Audible Swallowing: A few with stimulation  Type of Nipple: Everted at rest and after stimulation  Comfort (Breast/Nipple): Soft / non-tender (a little tenderness)  Hold (Positioning): Assistance needed to correctly position infant at breast and maintain latch.  LATCH Score: 8  LC repositioned baby at the breast (turned baby in towards mom tummy to mommy, nose to nipple, tissue sandwiched) and added support pillows. Baby had wide open mouth with flanged top/bottom lips. LC did put pressure on chin to widen latch slightly. Throughout the feeding mom notes less pain/discomfort.  Lactation Tools Discussed/Used    Interventions Interventions: Breast feeding basics reviewed;Assisted with latch;Hand express;Adjust position;Support pillows;Breast compression;Education (position/alignment, wide mouth/flange lips, feeding cues, feeding on demand)  Encouraged mom to practice with positioning, ensuring good alignment and baby turned in towards mom. Offered lanolin for tenderness, and normal initial tenderness with early feedings.  Encouraged to offer baby breast with early cues, hand express drops to encourage deep latch, and track output.  Discharge    Consult Status Consult Status: Follow-up    Danford Bad 11/15/2021, 5:05 PM

## 2021-11-15 NOTE — H&P (Signed)
OB History & Physical   History of Present Illness:  Chief Complaint:   HPI:  Stacey Wilcox is a 26 y.o. G40P1001 female at [redacted]w[redacted]d dated by 11wk [place].  She presents to L&D for painful uterine contractions since yesterday that have become stronger and closer together.  Active FM onset of ctx yesterday, currently every 5 minutes No LOF  Pregnancy Issues: Obesity Hx of gHTN Depression    Maternal Medical History:   Past Medical History:  Diagnosis Date   Medical history non-contributory     Past Surgical History:  Procedure Laterality Date   APPENDECTOMY      No Known Allergies  Prior to Admission medications   Medication Sig Start Date End Date Taking? Authorizing Provider  ibuprofen (ADVIL) 600 MG tablet Take 1 tablet (600 mg total) by mouth every 6 (six) hours. 09/25/19   Hildred Laser, MD  prenatal vitamin w/FE, FA (PRENATAL 1 + 1) 27-1 MG TABS tablet Take 1 tablet by mouth daily at 12 noon.    [provider]     [facility]  Social History: She  reports that she has never smoked. She has never used smokeless tobacco. She reports that she does not currently use alcohol. She reports that she does not currently use drugs.  Family History: family history is not on file.   Review of Systems: A full review of systems was performed and negative except as noted in the HPI.     Physical Exam:  Vital Signs: BP (!) 144/90 (BP Location: Right Arm)   Pulse 78   Temp 97.9 F (36.6 C) (Oral)   Resp 18  General: no acute distress.  HEENT: normocephalic, atraumatic Heart: regular rate & rhythm.  No murmurs/rubs/gallops Lungs: clear to auscultation bilaterally, normal respiratory effort Abdomen: soft, gravid, non-tender;  EFW: 7lb Pelvic:   External: Normal external female genitalia  Cervix: Dilation: 6 /   /      Extremities: non-tender, symmetric, mild edema bilaterally.  DTRs: +2  Neurologic: Alert & oriented x 3.    No  results found for this or any previous visit (from the past 24 hour(s)).  Pertinent Results:  Prenatal Labs: Blood type/Rh O pos  Antibody screen neg  Rubella Immune  Varicella Immune  RPR NR  HBsAg Neg  HIV NR  GC neg  Chlamydia neg  Genetic screening none  1 hour GTT 114  3 hour GTT N/a  GBS Negative   FHT: 135bpm, moderate variability, accelerations present, one variable with a contraction TOCO: contractions q35min, palpate moderate SVE:  Dilation: 6 /   /      Cephalic by leopolds  No results found.  Assessment:  Stacey Wilcox is a 26 y.o. G62P1001 female at [redacted]w[redacted]d with uterine contractions.   Plan:  1. Admit to Labor & Delivery; consents reviewed and obtained  2. Fetal Well being  - Fetal Tracing: Category II tracing (one variable deceleration) - Group B Streptococcus ppx indicated: n/a, GBS negative - Presentation: vertex confirmed by SVE   3. Routine OB: - Prenatal labs reviewed, as above - Rh positive - CBC, T&S, RPR on admit - Clear fluids, IVF  4. Monitoring of Labor -  Contractions q4min, external toco in place -  Pelvis proven to 2690g -  Plan for continuous fetal monitoring  -  Maternal pain control as desired; requesting regional anesthesia - Anticipate vaginal delivery  5. Post Partum Planning: - Infant feeding: undecided - Contraception:  undecided  Janyce Llanos, CNM 11/15/21 7:36 AM

## 2021-11-15 NOTE — Progress Notes (Signed)
Labor Progress Note  Stacey Wilcox is a 26 y.o. G3P1001 at [redacted]w[redacted]d by ultrasound admitted for active labor  Subjective: she is comfortable after her epidural  Objective: BP 126/70   Pulse 75   Temp 97.9 F (36.6 C) (Oral)   Resp 18   Ht 5\' 4"  (1.626 m)   Wt 107.5 kg   LMP 02/05/2021   SpO2 99%   BMI 40.68 kg/m  Vitals:   11/15/21 0730 11/15/21 0745 11/15/21 0819 11/15/21 0820  BP: (!) 144/90 (!) 142/93 126/84 126/84   11/15/21 0834 11/15/21 0904 11/15/21 0910 11/15/21 0932  BP: 122/81 (!) 140/90 (!) 108/92 126/70    Notable VS details: reviewed  Fetal Assessment: FHT:  FHR: 125 bpm, variability: moderate,  accelerations:  Present,  decelerations:  Absent Category/reactivity:  Category I UC:   regular, every 2-5 minutes SVE:    Dilation: 6cm  Effacement: 100%  Station:  -1  Consistency: medium  Position: middle  Membrane status:AROM @ 11/17/21  Amniotic color: clear  Labs: Lab Results  Component Value Date   WBC 13.0 (H) 11/15/2021   HGB 13.5 11/15/2021   HCT 39.3 11/15/2021   MCV 83.6 11/15/2021   PLT 282 11/15/2021    Assessment / Plan: Spontaneous labor, progressing normally  Labor:  Progressing normally, AROM with clear fluid Preeclampsia:   several elevated BPs, no severe ranges. P/C ratio 0.76. AST/ALT and platelets WNL Fetal Wellbeing:  Category I Pain Control:  Epidural I/D:   GBS negative Anticipated MOD:  NSVD  11/17/2021, CNM 11/15/2021, 9:39 AM

## 2021-11-15 NOTE — Discharge Summary (Incomplete)
Obstetrical Discharge Summary  Patient Name: Biddie Sebek DOB: [birth year] MRN: [id number]  Date of Admission: 11/15/2021 Date of Delivery: 11/15/21 Delivered by: Haroldine Laws, CNM  Date of Discharge: 11/15/2021  Primary OB: [facility] AJO:INOMVEH'M last menstrual period was 02/05/2021. EDC Estimated Date of Delivery: 11/12/21 Gestational Age at Delivery: [redacted]w[redacted]d   Antepartum complications:  Obesity Hx of gHTN Depression   Admitting Diagnosis: Encounter for induction of labor [Z34.90]  Secondary Diagnosis: Patient Active Problem List   Diagnosis Date Noted   NSVD (normal spontaneous vaginal delivery) 11/15/2021   Labor and delivery indication for care or intervention 09/24/2019   Pregnancy 09/23/2019    Discharge Diagnosis: {DX.:23714}      Augmentation***: {Augmentation:20782} Complications: {OB Labor/Delivery Complications:20784} Intrapartum complications/course: *** Delivery Type: {delivery type:32078} Anesthesia: {Treatments; birth pain:120016:x} Placenta: {Placenta delivery:32120::"spontaneous"} To Pathology: {YES/NO:21197::"No "} Laceration: {Laceration:32117} Episiotomy: none Newborn Data: Live born female  Birth Weight:   APGAR: 9, 9  Newborn Delivery   Birth date/time: 11/15/2021 13:29:00 Delivery type: Vaginal, Spontaneous      Postpartum Procedures: {postpartum procedures:3041411} Edinburgh:     09/24/2019    7:15 PM  Edinburgh Postnatal Depression Scale Screening Tool  I have been able to laugh and see the funny side of things. 1  I have looked forward with enjoyment to things. 1  I have blamed myself unnecessarily when things went wrong. 0  I have been anxious or worried for no good reason. 0  I have felt scared or panicky for no good reason. 0  Things have been getting on top of me. 0  I have been so unhappy that I have had difficulty sleeping. 0  I have felt sad or miserable. 0  I have been so unhappy that I have been crying.  0  The thought of harming myself has occurred to me. 0  Edinburgh Postnatal Depression Scale Total 2     Post partum course: *** Patient had an uncomplicated postpartum course.  By time of discharge on PPD#***, her pain was controlled on oral pain medications; she had appropriate lochia and was ambulating, voiding without difficulty and tolerating regular diet.  She was deemed stable for discharge to home.    ***(Cesarean Section):  Patient had an uncomplicated postpartum course.  By time of discharge on POD#***, her pain was controlled on oral pain medications; she had appropriate lochia and was ambulating, voiding without difficulty, tolerating regular diet and passing flatus.   She was deemed stable for discharge to home.    Discharge Physical Exam: *** BP 113/69   Pulse 70   Temp 97.8 F (36.6 C)   Resp 16   Ht 5\' 4"  (1.626 m)   Wt 107.5 kg   LMP 02/05/2021   SpO2 99%   BMI 40.68 kg/m   General: NAD CV: RRR Pulm: CTABL, nl effort ABD: s/nd/nt, fundus firm and below the umbilicus Lochia: moderate Perineum:***minimal edema/{OB Perineal assessment:24215} Incision: c/d/I, covered with occlusive OP site dressing *** DVT Evaluation: LE non-ttp, no evidence of DVT on exam.  Hemoglobin  Date Value Ref Range Status  11/15/2021 13.5 12.0 - 15.0 g/dL Final   HCT  Date Value Ref Range Status  11/15/2021 39.3 36.0 - 46.0 % Final    Risk assessment for postpartum VTE and prophylactic treatment: Very high risk factors: {KCVTEVHR:27790::"None"} High risk factors: {KCVTEHRF:27791} Moderate risk factors: {KCVTEMODRF:27792::"None"}  Postpartum VTE prophylaxis with LMWH {not indicated/requested/declined:14582::"not indicated"}  Disposition: stable, discharge to home. Baby Feeding: {AMINFANTFEED:27794} Baby Disposition: home with mom  Rh Immune globulin indicated: {Yes/No:304960894::"No"} Rubella vaccine given: {ACTIONS; WAS GIVEN/WAS NOT INDICATED:16401::"was not  indicated"} Varivax vaccine given: {ACTIONS; WAS GIVEN/WAS NOT INDICATED:16401::"was not indicated"} Flu vaccine given in AP setting: {YES/NO AS:20300::"Yes "} Tdap vaccine given in AP setting: {YES/NO AS:20300::"Yes "}  Contraception: {PLAN CONTRACEPTION:313102}  Prenatal Labs:  *** (copy from H&P)  Plan:  Tiombe Sheniah Supak was discharged to home in good condition. Follow-up appointment with delivering provider in 6 weeks.***  Discharge Medications: Allergies as of 11/15/2021   No Known Allergies   Med Rec must be completed prior to using this [facility]***        Follow-up Information     Center, [facility]. Schedule an appointment as soon as possible for a visit in 6 week(s).   Specialty: General Practice Contact information: [address] [place] [address] [phone]         Haroldine Laws, CNM Follow up.   Specialty: Certified Nurse Midwife Why: Call with any questions or concerns Contact information: [address] [place] [address] [phone]                 Signed: *** Hit refresh and delete this line

## 2021-11-16 DIAGNOSIS — O9081 Anemia of the puerperium: Secondary | ICD-10-CM | POA: Diagnosis not present

## 2021-11-16 LAB — COMPREHENSIVE METABOLIC PANEL
ALT: 8 U/L (ref 0–44)
AST: 23 U/L (ref 15–41)
Albumin: 2.6 g/dL — ABNORMAL LOW (ref 3.5–5.0)
Alkaline Phosphatase: 114 U/L (ref 38–126)
Anion gap: 9 (ref 5–15)
BUN: 13 mg/dL (ref 6–20)
CO2: 20 mmol/L — ABNORMAL LOW (ref 22–32)
Calcium: 8.7 mg/dL — ABNORMAL LOW (ref 8.9–10.3)
Chloride: 110 mmol/L (ref 98–111)
Creatinine, Ser: 0.68 mg/dL (ref 0.44–1.00)
GFR, Estimated: >60 mL/min (ref >60)
Glucose, Bld: 124 mg/dL — ABNORMAL HIGH (ref 70–99)
Potassium: 3.2 mmol/L — ABNORMAL LOW (ref 3.5–5.1)
Sodium: 139 mmol/L (ref 135–145)
Total Bilirubin: 0.4 mg/dL (ref 0.3–1.2)
Total Protein: 5.7 g/dL — ABNORMAL LOW (ref 6.5–8.1)

## 2021-11-16 LAB — CBC
HCT: 34.1 % — ABNORMAL LOW (ref 36.0–46.0)
Hemoglobin: 11.6 g/dL — ABNORMAL LOW (ref 12.0–15.0)
MCH: 28.8 pg (ref 26.0–34.0)
MCHC: 34 g/dL (ref 30.0–36.0)
MCV: 84.6 fL (ref 80.0–100.0)
Platelets: 219 10*3/uL (ref 150–400)
RBC: 4.03 MIL/uL (ref 3.87–5.11)
RDW: 14.4 % (ref 11.5–15.5)
WBC: 12.3 10*3/uL — ABNORMAL HIGH (ref 4.0–10.5)
nRBC: 0 % (ref 0.0–0.2)

## 2021-11-16 MED ORDER — IBUPROFEN 600 MG PO TABS: 600.0000 mg | ORAL_TABLET | Freq: Four times a day (QID) | ORAL | 0 refills | Status: AC | PRN

## 2021-11-16 MED ORDER — ACETAMINOPHEN 325 MG PO TABS: 650.0000 mg | ORAL_TABLET | ORAL | Status: AC | PRN

## 2021-11-16 NOTE — Discharge Summary (Signed)
Obstetrical Discharge Summary  Patient Name: Stacey Wilcox DOB: 1995-07-17 MRN: [id number]  Date of Admission: 11/15/2021 Date of Delivery: 11/15/21 Delivered by: Haroldine Laws, CNM  Date of Discharge: 11/16/2021  [facility]: [facility] YKD:[id number]'K last menstrual period was 02/05/2021. EDC Estimated Date of Delivery: 11/12/21 Gestational Age at Delivery: [redacted]w[redacted]d   Antepartum complications:  Obesity Hx of gHTN Depression   Admitting Diagnosis: Encounter for induction of labor [Z34.90]  Secondary Diagnosis: Patient Active Problem List   Diagnosis Date Noted   NSVD (normal spontaneous vaginal delivery) 11/15/2021   Labor and delivery indication for care or intervention 09/24/2019   Pregnancy 09/23/2019    Discharge Diagnosis: Term Pregnancy Delivered      Augmentation: AROM and Pitocin Complications: None Intrapartum complications/course: Stacey Wilcox presented to L&D in active labor.  She was initially expectantly managed.  Augmentation with AROM and then oxytocin was used after contractions became less frequent and no cervical change over several hours.  She progressed to complete and had a spontaneous vaginal birth of a live female over an intact perineum. The fetal head was delivered in OA position with restitution to ROA. No nuchal cord. Anterior then posterior shoulders delivered spontaneously. Baby placed on mom's abdomen and attended to by transition RN. Cord clamped and cut after 1+ min  by FOB. Cord segment given to nurse for gases. Cord blood obtained for newborn labs.  Delivery Type: spontaneous vaginal delivery Anesthesia: epidural anesthesia Placenta: spontaneous To Pathology: No  Laceration: 2nd degree and vaginal Episiotomy: none Newborn Data: Live born female  Birth Weight:  7lbs 1.6oz APGAR: 9, 9  Newborn Delivery   Birth date/time: 11/15/2021 13:29:00 Delivery type: Vaginal, Spontaneous      Postpartum Procedures: none Edinburgh:      09/24/2019    7:15 PM  Edinburgh Postnatal Depression Scale Screening Tool  I have been able to laugh and see the funny side of things. 1  I have looked forward with enjoyment to things. 1  I have blamed myself unnecessarily when things went wrong. 0  I have been anxious or worried for no good reason. 0  I have felt scared or panicky for no good reason. 0  Things have been getting on top of me. 0  I have been so unhappy that I have had difficulty sleeping. 0  I have felt sad or miserable. 0  I have been so unhappy that I have been crying. 0  The thought of harming myself has occurred to me. 0  Edinburgh Postnatal Depression Scale Total 2     Post partum course:  Patient had an uncomplicated postpartum course.  By time of discharge on PPD#1, her pain was controlled on oral pain medications; she had appropriate lochia and was ambulating, voiding without difficulty and tolerating regular diet.  She was deemed stable for discharge to home.    Discharge Physical Exam:  BP 111/66 (BP Location: Right Arm)   Pulse 81   Temp 98.6 F (37 C) (Oral)   Resp 18   Ht 5\' 4"  (1.626 m)   Wt 107.5 kg   LMP 02/05/2021   SpO2 98%   Breastfeeding Unknown   BMI 40.68 kg/m   General: NAD CV: RRR Pulm: CTABL, nl effort ABD: s/nd/nt, fundus firm and below the umbilicus Lochia: moderate Perineum: minimal edema/repair well approximated DVT Evaluation: LE non-ttp, no evidence of DVT on exam.  Hemoglobin  Date Value Ref Range Status  11/16/2021 11.6 (L) 12.0 - 15.0 g/dL Final   HCT  Date Value Ref Range Status  11/16/2021 34.1 (L) 36.0 - 46.0 % Final    Risk assessment for postpartum VTE and prophylactic treatment: Very high risk factors: None High risk factors: None Moderate risk factors: None  Postpartum VTE prophylaxis with LMWH not indicated  Disposition: stable, discharge to home. Baby Feeding: breast and formula feeding Baby Disposition: home with mom  Rh Immune globulin  indicated: No Rubella vaccine given: was not indicated Varivax vaccine given: was not indicated Flu vaccine given in AP setting: No Tdap vaccine given in AP setting: No  Contraception: Depo-Provera  Prenatal Labs:  Blood type/Rh O pos  Antibody screen neg  Rubella Immune  Varicella Immune  RPR NR  HBsAg Neg  HIV NR  GC neg  Chlamydia neg  Genetic screening none  1 hour GTT 114  3 hour GTT N/a  GBS Negative    Plan:  Stacey Wilcox was discharged to home in good condition. Follow-up appointment with prenatal provider in 6 weeks.  Discharge Medications: Allergies as of 11/16/2021   No Known Allergies      Medication List     TAKE these medications    acetaminophen 325 MG tablet Commonly known as: Tylenol Take 2 tablets (650 mg total) by mouth every 4 (four) hours as needed (for pain scale < 4).   ibuprofen 600 MG tablet Commonly known as: ADVIL Take 1 tablet (600 mg total) by mouth every 6 (six) hours as needed for mild pain or cramping. What changed:  when to take this reasons to take this   prenatal vitamin w/FE, FA 27-1 MG Tabs tablet Take 1 tablet by mouth daily at 12 noon.          Follow-up Information     Center, [facility]. Schedule an appointment as soon as possible for a visit in 6 week(s).   Specialty: General Practice Contact information: [address] [place] [address] [phone]                 Signed:  Margaretmary Eddy, CNM Certified Nurse Midwife [facility]

## 2021-11-16 NOTE — Discharge Instructions (Addendum)
Vaginal Delivery, Care After Refer to this sheet in the next few weeks. These discharge instructions provide you with information on caring for yourself after delivery. Your caregiver may also give you specific instructions. Your treatment has been planned according to the most current medical practices available, but problems sometimes occur. Call your caregiver if you have any problems or questions after you go home. HOME CARE INSTRUCTIONS Take over-the-counter or prescription medicines only as directed by your caregiver or pharmacist. Do not drink alcohol, especially if you are breastfeeding or taking medicine to relieve pain. Do not smoke tobacco. Continue to use good perineal care. Good perineal care includes: Wiping your perineum from back to front Keeping your perineum clean. You can do sitz baths twice a day, to help keep this area clean Do not use tampons, douche or have sex until your caregiver says it is okay. Shower only and avoid sitting in submerged water, aside from sitz baths Wear a well-fitting bra that provides breast support. Eat healthy foods. Drink enough fluids to keep your urine clear or pale yellow. Eat high-fiber foods such as whole grain cereals and breads, brown rice, beans, and fresh fruits and vegetables every day. These foods may help prevent or relieve constipation. Avoid constipation with high fiber foods or medications, such as miralax or metamucil Follow your caregiver's recommendations regarding resumption of activities such as climbing stairs, driving, lifting, exercising, or traveling. Talk to your caregiver about resuming sexual activities. Resumption of sexual activities is dependent upon your risk of infection, your rate of healing, and your comfort and desire to resume sexual activity. Try to have someone help you with your household activities and your newborn for at least a few days after you leave the hospital. Rest as much as possible. Try to rest or  take a nap when your newborn is sleeping. Increase your activities gradually. Keep all of your scheduled postpartum appointments. It is very important to keep your scheduled follow-up appointments. At these appointments, your caregiver will be checking to make sure that you are healing physically and emotionally. SEEK MEDICAL CARE IF:  You are passing large clots from your vagina. Save any clots to show your caregiver. You have a foul smelling discharge from your vagina. You have trouble urinating. You are urinating frequently. You have pain when you urinate. You have a change in your bowel movements. You have increasing redness, pain, or swelling near your vaginal incision (episiotomy) or vaginal tear. You have pus draining from your episiotomy or vaginal tear. Your episiotomy or vaginal tear is separating. You have painful, hard, or reddened breasts. You have a severe headache. You have blurred vision or see spots. You feel sad or depressed. You have thoughts of hurting yourself or your newborn. You have questions about your care, the care of your newborn, or medicines. You are dizzy or light-headed. You have a rash. You have nausea or vomiting. You were breastfeeding and have not had a menstrual period within 12 weeks after you stopped breastfeeding. You are not breastfeeding and have not had a menstrual period by the 12th week after delivery. You have a fever. SEEK IMMEDIATE MEDICAL CARE IF:  You have persistent pain. You have chest pain. You have shortness of breath. You faint. You have leg pain. You have stomach pain. Your vaginal bleeding saturates two or more sanitary pads in 1 hour. MAKE SURE YOU:  Understand these instructions. Will watch your condition. Will get help right away if you are not doing well or   get worse. Document Released: 12/16/1999 Document Revised: 05/04/2013 Document Reviewed: 08/15/2011 [facility] Patient Information 2015 [place], [place]. This  information is not intended to replace advice given to you by your health care provider. Make sure you discuss any questions you have with your health care provider.  Sitz Bath A sitz bath is a warm water bath taken in the sitting position. The water covers only the hips and butt (buttocks). We recommend using one that fits in the toilet, to help with ease of use and cleanliness. It may be used for either healing or cleaning purposes. Sitz baths are also used to relieve pain, itching, or muscle tightening (spasms). The water may contain medicine. Moist heat will help you heal and relax.  HOME CARE  Take 3 to 4 sitz baths a day. Fill the bathtub half-full with warm water. Sit in the water and open the drain a little. Turn on the warm water to keep the tub half-full. Keep the water running constantly. Soak in the water for 15 to 20 minutes. After the sitz bath, pat the affected area dry. GET HELP RIGHT AWAY IF: You get worse instead of better. Stop the sitz baths if you get worse. MAKE SURE YOU: Understand these instructions. Will watch your condition. Will get help right away if you are not doing well or get worse. Document Released: 01/26/2004 Document Revised: 09/12/2011 Document Reviewed: 04/17/2010 [facility] Patient Information 2015 [place], [place]. This information is not intended to replace advice given to you by your health care provider. Make sure you discuss any questions you have with your health care provider.  Instrucciones de descarga:  Llame a la oficina si tiene alguno de los siguientes: dolor de cabeza, cambios visuales, fiebre >101.0 F, escalofros, problemas con los senos (ingurgitacin, mastitis) sangrado vaginal excesivo, problemas o drenaje de la incisin, dolor o enrojecimiento en las piernas, depresin o cualquier otra inquietud.  Actividad: No levante ms de 10 libras durante 6 semanas. Sin relaciones sexuales ni tampones durante 6 semanas. No conducir durante 1 a 2  semanas o mientras est tomando analgsicos. No realizar actividades extenuantes ni levantar objetos pesados durante 6 semanas. No se permiten piscinas, jacuzzis ni baeras, solo duchas.  Es normal Orthoptist por 6 semanas. No debes empapar ms de 1 toalla sanitaria en 1 hora.  Continuar con la vitamina prenatal. Aumente las caloras y los lquidos durante la Market researcher.  Le bajar la [employer] prximos das (ahora mismo es Product manager). Es posible que tenga un poco de fiebre cuando le baje la [place], [place] debera desaparecer por s sola. Si no es as y [employer] 101 F, llame al mdico. Tambin sentirs dolor y tus senos estarn firmes. Tambin empezarn a Field seismologist. Si est amamantando, contine como [place] y podr extraerse leche para su comodidad.  Si tiene inquietudes sobre su beb, llame a su pediatra. Si tiene inquietudes [employer], puede comunicarse con la asesora en lactancia al [phone].  La tristeza posparto (sentimientos de felicidad un minuto y tristeza otro minuto) es normal durante las primeras semanas, pero si empeora, infrmeselo a su mdico.

## 2021-11-16 NOTE — Progress Notes (Signed)
Patient discharged home with family.  Discharge instructions, when to follow up, and prescriptions reviewed with patient.  Patient verbalized understanding. Patient will be escorted out by auxiliary.   

## 2021-11-16 NOTE — Progress Notes (Signed)
Postpartum Day  1  Subjective: no complaints, up ad lib, voiding, and tolerating PO  Doing well, no concerns. Ambulating without difficulty, pain managed with PO meds, tolerating regular diet, and voiding without difficulty.   No fever/chills, chest pain, shortness of breath, nausea/vomiting, or leg pain. No nipple or breast pain. No headache, visual changes, or RUQ/epigastric pain.  Objective: BP 111/66 (BP Location: Right Arm)   Pulse 81   Temp 98.6 F (37 C) (Oral)   Resp 18   Ht 5\' 4"  (1.626 m)   Wt 107.5 kg   LMP 02/05/2021   SpO2 98%   Breastfeeding Unknown   BMI 40.68 kg/m    Physical Exam:  General: alert, cooperative, and no distress Breasts: soft/nontender CV: RRR Pulm: nl effort, CTABL Abdomen: soft, non-tender, active bowel sounds Uterine Fundus: firm Perineum: minimal edema, repair well approximated Lochia: appropriate DVT Evaluation: No evidence of DVT seen on physical exam.  Recent Labs    11/15/21 0743 11/16/21 0535  HGB 13.5 11.6*  HCT 39.3 34.1*  WBC 13.0* 12.3*  PLT 282 219    Assessment/Plan: 26 y.o. G3P2002 postpartum day # 1  -Continue routine postpartum care -Lactation consult PRN for breastfeeding  -Discussed contraceptive options including implant, IUDs hormonal and non-hormonal, injection, pills/ring/patch, condoms, and NFP.  Desires Depo Provera 4-6 weeks postpartum.  -Acute blood loss anemia - hemodynamically stable and asymptomatic; start PO ferrous sulfate BID with stool softeners  -Immunization status:   all immunizations up to date   Disposition: Continue inpatient postpartum care. Desires discharge home today   LOS: 1 day   22, Gustavo Lah 11/16/2021, 8:58 AM   ----- 11/18/2021  Certified Nurse Midwife [facility]

## 2021-11-16 NOTE — Anesthesia Postprocedure Evaluation (Signed)
Anesthesia Post Note  Patient: Stacey Wilcox  Procedure(s) Performed: AN AD HOC LABOR EPIDURAL  Patient location during evaluation: Mother Baby Anesthesia Type: Epidural Level of consciousness: awake and alert Pain management: pain level controlled Vital Signs Assessment: post-procedure vital signs reviewed and stable Respiratory status: spontaneous breathing, nonlabored ventilation and respiratory function stable Cardiovascular status: stable Postop Assessment: no headache, no backache and epidural receding Anesthetic complications: no   No notable events documented.   Last Vitals:  Vitals:   11/16/21 0334 11/16/21 0752  BP: 110/78 111/66  Pulse: 82 81  Resp: 18 18  Temp: 36.7 C 37 C  SpO2: 100% 98%    Last Pain:  Vitals:   11/16/21 0930  TempSrc:   PainSc: 6                  [name]

## 2021-11-17 LAB — BPAM RBC
Blood Product Expiration Date: 202312152359
Blood Product Expiration Date: 202312152359
Blood Product Expiration Date: 202312182359
ISSUE DATE / TIME: 202311142038
ISSUE DATE / TIME: 202311152145
Unit Type and Rh: 5100
Unit Type and Rh: 5100
Unit Type and Rh: 5100

## 2021-11-17 LAB — TYPE AND SCREEN
ABO/RH(D): O POS
Antibody Screen: POSITIVE
Unit division: 0
Unit division: 0
Unit division: 0

## 2021-12-01 DIAGNOSIS — Z419 Encounter for procedure for purposes other than remedying health state, unspecified: Secondary | ICD-10-CM | POA: Diagnosis not present

## 2021-12-27 DIAGNOSIS — Z309 Encounter for contraceptive management, unspecified: Secondary | ICD-10-CM | POA: Diagnosis not present

## 2021-12-27 DIAGNOSIS — Z1389 Encounter for screening for other disorder: Secondary | ICD-10-CM | POA: Diagnosis not present

## 2021-12-27 DIAGNOSIS — F331 Major depressive disorder, recurrent, moderate: Secondary | ICD-10-CM | POA: Diagnosis not present

## 2021-12-27 DIAGNOSIS — Z1331 Encounter for screening for depression: Secondary | ICD-10-CM | POA: Diagnosis not present

## 2022-01-01 DIAGNOSIS — Z419 Encounter for procedure for purposes other than remedying health state, unspecified: Secondary | ICD-10-CM | POA: Diagnosis not present

## 2022-02-01 DIAGNOSIS — Z419 Encounter for procedure for purposes other than remedying health state, unspecified: Secondary | ICD-10-CM | POA: Diagnosis not present

## 2022-03-02 DIAGNOSIS — Z419 Encounter for procedure for purposes other than remedying health state, unspecified: Secondary | ICD-10-CM | POA: Diagnosis not present

## 2022-04-02 DIAGNOSIS — Z1389 Encounter for screening for other disorder: Secondary | ICD-10-CM | POA: Diagnosis not present

## 2022-04-02 DIAGNOSIS — Z419 Encounter for procedure for purposes other than remedying health state, unspecified: Secondary | ICD-10-CM | POA: Diagnosis not present

## 2022-04-02 DIAGNOSIS — Z309 Encounter for contraceptive management, unspecified: Secondary | ICD-10-CM | POA: Diagnosis not present

## 2022-04-16 ENCOUNTER — Telehealth: Payer: Self-pay

## 2022-04-16 NOTE — Telephone Encounter (Signed)
Patient reached by interpretor ID #[id number] and asked to schedule an appointment with PCP. Patient declined to schedule an appointment at this time. AS, CMA

## 2022-05-02 DIAGNOSIS — Z419 Encounter for procedure for purposes other than remedying health state, unspecified: Secondary | ICD-10-CM | POA: Diagnosis not present

## 2022-05-14 DIAGNOSIS — M7090 Unspecified soft tissue disorder related to use, overuse and pressure of unspecified site: Secondary | ICD-10-CM | POA: Diagnosis not present

## 2022-05-14 DIAGNOSIS — Z1389 Encounter for screening for other disorder: Secondary | ICD-10-CM | POA: Diagnosis not present

## 2022-05-14 DIAGNOSIS — Z309 Encounter for contraceptive management, unspecified: Secondary | ICD-10-CM | POA: Diagnosis not present

## 2022-05-14 DIAGNOSIS — F331 Major depressive disorder, recurrent, moderate: Secondary | ICD-10-CM | POA: Diagnosis not present

## 2022-06-02 DIAGNOSIS — Z419 Encounter for procedure for purposes other than remedying health state, unspecified: Secondary | ICD-10-CM | POA: Diagnosis not present

## 2022-07-02 DIAGNOSIS — Z419 Encounter for procedure for purposes other than remedying health state, unspecified: Secondary | ICD-10-CM | POA: Diagnosis not present

## 2022-08-02 DIAGNOSIS — Z419 Encounter for procedure for purposes other than remedying health state, unspecified: Secondary | ICD-10-CM | POA: Diagnosis not present

## 2022-09-02 DIAGNOSIS — Z419 Encounter for procedure for purposes other than remedying health state, unspecified: Secondary | ICD-10-CM | POA: Diagnosis not present

## 2022-10-02 DIAGNOSIS — Z419 Encounter for procedure for purposes other than remedying health state, unspecified: Secondary | ICD-10-CM | POA: Diagnosis not present

## 2022-11-02 DIAGNOSIS — Z419 Encounter for procedure for purposes other than remedying health state, unspecified: Secondary | ICD-10-CM | POA: Diagnosis not present

## 2022-12-02 DIAGNOSIS — Z419 Encounter for procedure for purposes other than remedying health state, unspecified: Secondary | ICD-10-CM | POA: Diagnosis not present

## 2022-12-20 ENCOUNTER — Encounter: Payer: Self-pay | Admitting: Advanced Practice Midwife

## 2022-12-20 ENCOUNTER — Ambulatory Visit: Payer: Medicaid Other

## 2022-12-20 VITALS — BP 121/80 | HR 88 | Wt 224.2 lb

## 2022-12-20 DIAGNOSIS — Z309 Encounter for contraceptive management, unspecified: Secondary | ICD-10-CM | POA: Diagnosis not present

## 2022-12-20 DIAGNOSIS — Z30013 Encounter for initial prescription of injectable contraceptive: Secondary | ICD-10-CM

## 2022-12-20 DIAGNOSIS — E669 Obesity, unspecified: Secondary | ICD-10-CM | POA: Insufficient documentation

## 2022-12-20 DIAGNOSIS — Z3202 Encounter for pregnancy test, result negative: Secondary | ICD-10-CM | POA: Diagnosis not present

## 2022-12-20 DIAGNOSIS — Z3009 Encounter for other general counseling and advice on contraception: Secondary | ICD-10-CM | POA: Diagnosis not present

## 2022-12-20 DIAGNOSIS — R87619 Unspecified abnormal cytological findings in specimens from cervix uteri: Secondary | ICD-10-CM | POA: Insufficient documentation

## 2022-12-20 DIAGNOSIS — O1494 Unspecified pre-eclampsia, complicating childbirth: Secondary | ICD-10-CM | POA: Insufficient documentation

## 2022-12-20 DIAGNOSIS — Z720 Tobacco use: Secondary | ICD-10-CM | POA: Diagnosis not present

## 2022-12-20 LAB — HM HIV SCREENING LAB: HM HIV Screening: NEGATIVE

## 2022-12-20 LAB — HM HEPATITIS C SCREENING LAB: HM Hepatitis Screen: NEGATIVE

## 2022-12-20 LAB — WET PREP FOR TRICH, YEAST, CLUE
Trichomonas Exam: NEGATIVE
Yeast Exam: NEGATIVE

## 2022-12-20 LAB — HEMOGLOBIN, FINGERSTICK: Hemoglobin: 13.4 g/dL (ref 11.1–15.9)

## 2022-12-20 LAB — PREGNANCY, URINE: Preg Test, Ur: NEGATIVE

## 2022-12-20 MED ORDER — MEDROXYPROGESTERONE ACETATE 150 MG/ML IM SUSP
150.0000 mg | Freq: Once | INTRAMUSCULAR | Status: AC
  Administered 2022-12-20: 150 mg via INTRAMUSCULAR

## 2022-12-20 NOTE — Progress Notes (Signed)
Patient is here for [facility] visit. FP packet given to patient and contents reviewed. Wet prep results reviewed with pt, no treatment required per standing order. Depo injection given at the Rt Deltiod and Pt encouraged to Schedule appointment for Nexplanon insertion. Sonda Primes, RN.

## 2022-12-20 NOTE — Progress Notes (Signed)
[facility] [address] Main Number: [id number]   Family Planning Visit- Initial Visit  Subjective:  Stacey Wilcox is a 27 y.o. MHF exvaper  [id number] (3,1)  being seen today for an initial annual visit and to discuss reproductive life planning.  The patient is currently using No Method - Other Reason for pregnancy prevention. Patient reports   does not want a pregnancy in the next year.     report they are looking for a method that provides High efficacy at preventing pregnancy  Patient has the following medical conditions has Obesity and Abnormal Pap smear of cervix on their problem list.  Chief Complaint  Patient presents with   Annual Exam    Pt is here for PE and Nexplanon insertion.    Patient reports here for physical and Nexplanon insertion. LMP 12/03/22. Last sex 12/17/22 without condom; with current partner x 5 years; 1 partner in last 3 months. Last vaped 2021. Last ETOH 2021 (1/2 bottle liquor). In [place] since 2017, not working, living with husband and 2 children. Not breastfeeding. Hx preeclampsia with last pregnancy. Pt states she didn't feel well last week and checked her BS=179. Last dental exam 2017 in [place]. Highest grade completed 6th. Hx abnormal paps (not visible in Epic) pt states done in [place]:  10/2018 LSIL, 12/2020 ASCUS HPV-, 01/11/21 colpo with no dysplasia. No f/u since colpo.  SVD 11/15/21 F 7#1 with epidural  Patient denies cigs, cigars, MJ  Body mass index is 38.48 kg/m. - Patient is eligible for diabetes screening based on BMI> 25 and age >35?  not applicable HA1C ordered? Not applicable  Patient reports 1  partner/s in last year. Desires STI screening?  Yes  Has patient been screened once for HCV in the past?  Yes  No results found for: "HCVAB"  Does the patient have current drug use (including MJ), have a partner with drug use, and/or has been incarcerated since last  result? No  If yes-- Screen for HCV through [facility]   Does the patient meet criteria for HBV testing? Yes  Criteria:  -Household, sexual or needle sharing contact with HBV -History of drug use -HIV positive -Those with known Hep C   Health Maintenance Due  Topic Date Due   Hepatitis C Screening  Never done   DTaP/Tdap/Td (1 - Tdap) Never done   Cervical Cancer Screening (Pap smear)  Never done   INFLUENZA VACCINE  08/02/2022   COVID-19 Vaccine (1 - 2024-25 season) Never done    Review of Systems  Cardiovascular:  Positive for chest pain (c/o pressure in center of chest intermittently with stress onset 2 wks ago; referred to primary care MD).  Neurological:  Positive for headaches (if poor sleep 4x/mo relieved with sleep and Excedrin; always occipital and bil temples).  All other systems reviewed and are negative.   The following portions of the patient's history were reviewed and updated as appropriate: allergies, current medications, past family history, past medical history, past social history, past surgical history and problem list. Problem list updated.   See flowsheet for other program required questions.  Objective:   Vitals:   12/20/22 1535  BP: 121/80  Pulse: 88  Weight: 224 lb 3.2 oz (101.7 kg)    Physical Exam Constitutional:      Appearance: Normal appearance. She is obese.  HENT:     Head: Normocephalic and atraumatic.     Mouth/Throat:  Mouth: Mucous membranes are moist.     Comments: Last dental exam 2017 in [place] Eyes:     Conjunctiva/sclera: Conjunctivae normal.  Neck:     Thyroid: No thyroid mass, thyromegaly or thyroid tenderness.  Cardiovascular:     Rate and Rhythm: Normal rate and regular rhythm.  Pulmonary:     Effort: Pulmonary effort is normal.     Breath sounds: Normal breath sounds.  Chest:  Breasts:    Right: Normal.     Left: Normal.  Abdominal:     Palpations: Abdomen is soft.     Comments: Soft without masses  or tenderness, poor tone  Genitourinary:    General: Normal vulva.     Exam position: Lithotomy position.     Pubic Area: No pubic lice.      Vagina: Vaginal discharge (white creamy leukorrhea, ph<4.5) present.     Cervix: Normal.     Uterus: Normal.      Rectum: Normal.     Comments: No evidence of nits Pap done Musculoskeletal:        General: Normal range of motion.     Cervical back: Normal range of motion and neck supple.  Skin:    General: Skin is warm and dry.  Neurological:     Mental Status: She is alert.  Psychiatric:        Mood and Affect: Mood normal.   Estill Dooms chaperone    Assessment and Plan:  Stacey Wilcox is a 27 y.o. female presenting to the [facility] Department for an initial annual wellness/contraceptive visit  Contraception counseling: Reviewed options based on patient desire and reproductive life plan. Patient is interested in Hormonal Implant. This was not provided to the patient today.  if not why not clearly documented; unprotected sex 2 days ago  Risks, benefits, and typical effectiveness rates were reviewed.  Questions were answered.  Written information was also given to the patient to review.    The patient will follow up in  10 weeks for Nexplanon insertion after PT.  The patient was told to call with any further questions, or with any concerns about this method of contraception.  Emphasized use of condoms 100% of the time for STI prevention.  Educated on ECP and assessed for need of ECP. Patient reported Unprotected sex within past 120 hours.  Reviewed options and patient desired No method of ECP, declined all    1. Family planning (Primary) If PT neg today pt desires DMPA today Please counsel on need for abstinance next 7 days Pt then desires to return after 12/31/22 for Nexplanon insertion if PT neg on that day Pt counseled to do PT 12/31/22 and call us if + Treat wet mount per standing orders Immunization  nurse consult Referred to primary care MD for c/o Encouraged dental exam asap  - Pregnancy, urine - WET PREP FOR TRICH, YEAST, CLUE - Hemoglobin, venipuncture - Chlamydia/Gonorrhea [place] [facility] - HIV/HCV [place] [facility] - Syphilis Serology, [place] [facility] - IGP, rfx Aptima HPV ASCU  2. Obesity, unspecified class, unspecified obesity type, unspecified whether serious comorbidity present   3. Abnormal cervical Papanicolaou smear, unspecified abnormal pap finding Pap done today No paps visible in Epic and all done in [facility] per pt   No follow-ups on file.  No future appointments.  Alberteen Spindle, CNM

## 2022-12-30 LAB — IGP, RFX APTIMA HPV ASCU: PAP Smear Comment: 0

## 2023-01-02 DIAGNOSIS — Z419 Encounter for procedure for purposes other than remedying health state, unspecified: Secondary | ICD-10-CM | POA: Diagnosis not present

## 2023-01-28 DIAGNOSIS — Z131 Encounter for screening for diabetes mellitus: Secondary | ICD-10-CM | POA: Diagnosis not present

## 2023-01-28 DIAGNOSIS — Z0000 Encounter for general adult medical examination without abnormal findings: Secondary | ICD-10-CM | POA: Diagnosis not present

## 2023-01-28 DIAGNOSIS — Z309 Encounter for contraceptive management, unspecified: Secondary | ICD-10-CM | POA: Diagnosis not present

## 2023-01-28 DIAGNOSIS — Z1389 Encounter for screening for other disorder: Secondary | ICD-10-CM | POA: Diagnosis not present

## 2023-01-28 DIAGNOSIS — Z3200 Encounter for pregnancy test, result unknown: Secondary | ICD-10-CM | POA: Diagnosis not present

## 2023-01-28 DIAGNOSIS — Z23 Encounter for immunization: Secondary | ICD-10-CM | POA: Diagnosis not present

## 2023-01-28 DIAGNOSIS — Z1331 Encounter for screening for depression: Secondary | ICD-10-CM | POA: Diagnosis not present

## 2023-02-01 ENCOUNTER — Ambulatory Visit: Payer: Medicaid Other

## 2023-02-02 DIAGNOSIS — Z419 Encounter for procedure for purposes other than remedying health state, unspecified: Secondary | ICD-10-CM | POA: Diagnosis not present

## 2023-03-02 DIAGNOSIS — Z419 Encounter for procedure for purposes other than remedying health state, unspecified: Secondary | ICD-10-CM | POA: Diagnosis not present

## 2023-03-14 DIAGNOSIS — R87612 Low grade squamous intraepithelial lesion on cytologic smear of cervix (LGSIL): Secondary | ICD-10-CM | POA: Diagnosis not present

## 2023-03-14 DIAGNOSIS — Z1389 Encounter for screening for other disorder: Secondary | ICD-10-CM | POA: Diagnosis not present

## 2023-03-14 DIAGNOSIS — Z309 Encounter for contraceptive management, unspecified: Secondary | ICD-10-CM | POA: Diagnosis not present

## 2023-03-14 DIAGNOSIS — Z3200 Encounter for pregnancy test, result unknown: Secondary | ICD-10-CM | POA: Diagnosis not present

## 2023-03-14 DIAGNOSIS — R2231 Localized swelling, mass and lump, right upper limb: Secondary | ICD-10-CM | POA: Diagnosis not present

## 2023-03-19 DIAGNOSIS — R2231 Localized swelling, mass and lump, right upper limb: Secondary | ICD-10-CM | POA: Diagnosis not present

## 2023-03-19 DIAGNOSIS — Z1389 Encounter for screening for other disorder: Secondary | ICD-10-CM | POA: Diagnosis not present

## 2023-03-28 ENCOUNTER — Ambulatory Visit: Payer: Self-pay | Admitting: General Surgery

## 2023-03-28 DIAGNOSIS — Q838 Other congenital malformations of breast: Secondary | ICD-10-CM | POA: Diagnosis not present

## 2023-03-28 NOTE — H&P (Signed)
 History of Present Illness Stacey Wilcox is a 28 year old female who presents with a cyst in the axillary area. Her husband plans to accompany her for the procedure.   She has had a mass in her axillary area since she was 28 years old. Initially, it was asymptomatic, but recently it has increased in size and has become painful when touched.  She denies any pain radiation.  Pain aggravated by applying pressure.  No alleviating factors.   No infection, drainage, bleeding, or color change associated with the mass. The mass is easily visible without needing to remove clothing.        PAST MEDICAL HISTORY:  Past medical history reviewed.  No pertinent past medical history     PAST SURGICAL HISTORY:   Past Surgical History  History reviewed. No pertinent surgical history.         MEDICATIONS:  Encounter Medications  No outpatient encounter medications on file as of 03/28/2023.    No facility-administered encounter medications on file as of 03/28/2023.        ALLERGIES:   Patient has no known allergies.   SOCIAL HISTORY:  Social History  Social History        Socioeconomic History   Marital status: Single  Tobacco Use   Smoking status: Never  Vaping Use   Vaping status: Never Used    Social Drivers of Acupuncturist Strain: Low Risk  (03/28/2023)    Overall Financial Resource Strain (CARDIA)     Difficulty of Paying Living Expenses: Not hard at all  Food Insecurity: No Food Insecurity (03/28/2023)    Hunger Vital Sign     Worried About Running Out of Food in the Last Year: Never true     Ran Out of Food in the Last Year: Never true  Transportation Needs: No Transportation Needs (03/28/2023)    PRAPARE - Therapist, art (Medical): No     Lack of Transportation (Non-Medical): No        FAMILY HISTORY:  Family history reviewed.  No pertinent family history   GENERAL REVIEW OF SYSTEMS:    General ROS: negative  for - chills, fatigue, fever, weight gain or weight loss Allergy and Immunology ROS: negative for - hives  Hematological and Lymphatic ROS: negative for - bleeding problems or bruising, negative for palpable nodes Endocrine ROS: negative for - heat or cold intolerance, hair changes Respiratory ROS: negative for - cough, shortness of breath or wheezing Cardiovascular ROS: no chest pain or palpitations GI ROS: negative for nausea, vomiting, abdominal pain, diarrhea, constipation Musculoskeletal ROS: negative for - joint swelling or muscle pain Neurological ROS: negative for - confusion, syncope Dermatological ROS: negative for pruritus and rash   PHYSICAL EXAM:     Vitals:    03/28/23 1508  BP: 133/86  Pulse: 90  .  Ht:162.6 cm (5\' 4" ) Wt:(!) 102.5 kg (226 lb) UJW:JXBJ surface area is 2.15 meters squared. Body mass index is 38.79 kg/m.Marland Kitchen   GENERAL: Alert, active, oriented x3   HEENT: Pupils equal reactive to light. Extraocular movements are intact. Sclera clear. Palpebral conjunctiva normal red color.Pharynx clear.   NECK: Supple with no palpable mass and no adenopathy.   LUNGS: Sound clear with no rales rhonchi or wheezes.   HEART: Regular rhythm S1 and S2 without murmur.   EXTREMITIES: Well-developed well-nourished symmetrical with no dependent edema.  There is a large soft tissue mass  in the axillary area, rubbery, mobile.  Not fixed to thoracic wall.  No axillary adenopathy.   NEUROLOGICAL: Awake alert oriented, facial expression symmetrical, moving all extremities.    Assessment & Plan Axillary mass   A chronic axillary mass, present since age 65, has increased in size and causes discomfort upon palpation. The differential diagnosis includes lipoma and accessory (ectopic) breast tissue, with no signs of infection, drainage, bleeding, or color change. Surgical excision is recommended due to discomfort and size increase. Pathological examination will determine if the mass is  benign adipose tissue or accessory breast tissue, with no current concern for malignancy. The procedure is a same-day surgery with an expected recovery time of one to two weeks, depending on the mass's depth. Post-surgery, the area may experience discomfort due to frequent movement, requiring inflammation and pain management. Schedule surgical excision for next week and arrange for pathological examination of the excised tissue. Ensure she has transportation and support post-surgery.    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    Carolan Shiver, MD   Electronically signed by Carolan Shiver, MD

## 2023-03-28 NOTE — H&P (View-Only) (Signed)
 History of Present Illness Stacey Wilcox is a 28 year old female who presents with a cyst in the axillary area. Her husband plans to accompany her for the procedure.   She has had a mass in her axillary area since she was 28 years old. Initially, it was asymptomatic, but recently it has increased in size and has become painful when touched.  She denies any pain radiation.  Pain aggravated by applying pressure.  No alleviating factors.   No infection, drainage, bleeding, or color change associated with the mass. The mass is easily visible without needing to remove clothing.        PAST MEDICAL HISTORY:  Past medical history reviewed.  No pertinent past medical history     PAST SURGICAL HISTORY:   Past Surgical History  History reviewed. No pertinent surgical history.         MEDICATIONS:  Encounter Medications  No outpatient encounter medications on file as of 03/28/2023.    No facility-administered encounter medications on file as of 03/28/2023.        ALLERGIES:   Patient has no known allergies.   SOCIAL HISTORY:  Social History  Social History        Socioeconomic History   Marital status: Single  Tobacco Use   Smoking status: Never  Vaping Use   Vaping status: Never Used    Social Drivers of Acupuncturist Strain: Low Risk  (03/28/2023)    Overall Financial Resource Strain (CARDIA)     Difficulty of Paying Living Expenses: Not hard at all  Food Insecurity: No Food Insecurity (03/28/2023)    Hunger Vital Sign     Worried About Running Out of Food in the Last Year: Never true     Ran Out of Food in the Last Year: Never true  Transportation Needs: No Transportation Needs (03/28/2023)    PRAPARE - Therapist, art (Medical): No     Lack of Transportation (Non-Medical): No        FAMILY HISTORY:  Family history reviewed.  No pertinent family history   GENERAL REVIEW OF SYSTEMS:    General ROS: negative  for - chills, fatigue, fever, weight gain or weight loss Allergy and Immunology ROS: negative for - hives  Hematological and Lymphatic ROS: negative for - bleeding problems or bruising, negative for palpable nodes Endocrine ROS: negative for - heat or cold intolerance, hair changes Respiratory ROS: negative for - cough, shortness of breath or wheezing Cardiovascular ROS: no chest pain or palpitations GI ROS: negative for nausea, vomiting, abdominal pain, diarrhea, constipation Musculoskeletal ROS: negative for - joint swelling or muscle pain Neurological ROS: negative for - confusion, syncope Dermatological ROS: negative for pruritus and rash   PHYSICAL EXAM:     Vitals:    03/28/23 1508  BP: 133/86  Pulse: 90  .  Ht:162.6 cm (5\' 4" ) Wt:(!) 102.5 kg (226 lb) UJW:JXBJ surface area is 2.15 meters squared. Body mass index is 38.79 kg/m.Marland Kitchen   GENERAL: Alert, active, oriented x3   HEENT: Pupils equal reactive to light. Extraocular movements are intact. Sclera clear. Palpebral conjunctiva normal red color.Pharynx clear.   NECK: Supple with no palpable mass and no adenopathy.   LUNGS: Sound clear with no rales rhonchi or wheezes.   HEART: Regular rhythm S1 and S2 without murmur.   EXTREMITIES: Well-developed well-nourished symmetrical with no dependent edema.  There is a large soft tissue mass  in the axillary area, rubbery, mobile.  Not fixed to thoracic wall.  No axillary adenopathy.   NEUROLOGICAL: Awake alert oriented, facial expression symmetrical, moving all extremities.    Assessment & Plan Axillary mass   A chronic axillary mass, present since age 65, has increased in size and causes discomfort upon palpation. The differential diagnosis includes lipoma and accessory (ectopic) breast tissue, with no signs of infection, drainage, bleeding, or color change. Surgical excision is recommended due to discomfort and size increase. Pathological examination will determine if the mass is  benign adipose tissue or accessory breast tissue, with no current concern for malignancy. The procedure is a same-day surgery with an expected recovery time of one to two weeks, depending on the mass's depth. Post-surgery, the area may experience discomfort due to frequent movement, requiring inflammation and pain management. Schedule surgical excision for next week and arrange for pathological examination of the excised tissue. Ensure she has transportation and support post-surgery.    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    Carolan Shiver, MD   Electronically signed by Carolan Shiver, MD

## 2023-04-02 ENCOUNTER — Encounter
Admission: RE | Admit: 2023-04-02 | Discharge: 2023-04-02 | Disposition: A | Discharge status: Home / Self Care | Source: Ambulatory Visit | Attending: General Surgery | Admitting: General Surgery

## 2023-04-02 ENCOUNTER — Encounter: Payer: Self-pay | Admitting: *Deleted

## 2023-04-02 ENCOUNTER — Other Ambulatory Visit: Payer: Self-pay

## 2023-04-02 VITALS — Ht 64.0 in | Wt 226.0 lb

## 2023-04-02 DIAGNOSIS — Z01812 Encounter for preprocedural laboratory examination: Secondary | ICD-10-CM

## 2023-04-02 HISTORY — DX: Unspecified asthma, uncomplicated: J45.909

## 2023-04-02 NOTE — Patient Instructions (Signed)
 Su procedimiento est programado para: Viernes 04/05/23 Presntese en el mostrador de Tax adviser del [employer] Para saber su hora de llegada, llame al [phone] entre la 1:00 p. m. y las 3:00 p. m. en: Peggye Ley 04/04/23  Si su hora de llegada es a las 6:00 am, no llegue antes de esa hora ya que las puertas de [place] [facility] no se abren Teacher, adult education las 6:00 am.  RECORDAR: Las instrucciones que no se siguen completamente pueden provocar riesgos mdicos graves, que pueden llegar hasta la [place]; o, segn el criterio de su cirujano y Scientific laboratory technician, es posible que sea Aeronautical engineer su Leisure centre manager.  No ingiera alimentos ni beba liquidos despus de la medianoche del da anterior a la ciruga. No mascar chicle ni caramelos duros.   Una semana antes de la ciruga: Detenga los antiinflamatorios (AINE) como Advil, Aleve, Ibuprofeno, Motrin, Naproxen, Naprosyn y productos a base de aspirina como Excedrin, Goody's Powder, BC Powder. Suspenda CUALQUIER suplemento de venta libre hasta despus de la [place]. Sin embargo, puede Educational psychologist tomando Tylenol si es necesario para Marketing executive de la [place]. Contine tomando todos los medicamentos recetados, excepto los siguientes:   Siga las recomendaciones del cardilogo o PCP con respecto a suspender los anticoagulantes.  TOME SLO ESTOS MEDICAMENTOS LA MAANA DE LA CIRUGA CON UN SORBO DE AGUA:  Ninguna    No consumir alcohol durante 24 horas antes o despus de la [place].  No fumar, incluidos los cigarrillos electrnicos, durante las 24 horas previas a la [place]. No consumir productos de tabaco masticables durante al menos 6 horas antes de la [place]. Sin parches de Optometrist de la [place].  No use ningn medicamento "recreativo" durante al menos una semana (preferiblemente 2 semanas) antes de la ciruga. Tenga en cuenta que la combinacin de cocana y anestesia puede [employer], que pueden  llegar hasta la [place]. Si su prueba de cocana da positivo, su ciruga ser cancelada.  La maana de la ciruga cepille sus dientes con pasta dental y agua, puede enjuagarse la boca con enjuague bucal si lo desea. No ingiera pasta de dientes ni enjuague bucal.  Utilice jabn o toallitas CHG como se indica en la hoja de instrucciones.  No use joyas, maquillaje, horquillas, clips ni esmalte de uas.  No use lociones, polvos ni perfumes.  No se afeite el vello corporal desde el cuello hacia abajo 48 horas antes de la [place].  No se pueden usar lentes de contacto, audfonos ni dentaduras postizas durante la [place].  No lleve objetos de valor al hospital. [facility] no es responsable de ninguna pertenencia u objeto de valor perdido o perdido.  Notifique a su mdico si hay algn cambio en su condicin mdica (resfriado, fiebre, infeccin).  Lleve ropa cmoda (especfica para su tipo de [place]) al hospital.  Despus de la ciruga, usted puede ayudar a prevenir complicaciones pulmonares haciendo ejercicios de respiracin. Respire profundamente y tosa cada 1 o 2 horas. Su mdico puede indicarle un dispositivo llamado espirmetro incentivador para ayudarle a respirar profundamente. Al toser o Engineering geologist, sostenga firmemente una almohada contra la incisin con ambas manos. Esto se llama "ferulizacin". Hacer esto ayuda a proteger su incisin. Tambin disminuye las molestias abdominales.  Si vas a pasar la noche en el hospital, deja tu maleta en el coche. Despus de la [place], es posible que lo lleven a su habitacin.  En caso de un mayor censo de [place], puede ser necesario que [place],  el paciente, contine con su atencin posoperatoria en el departamento de Ciruga el [place]ismo Da.  Si le dan el alta el da de la [place], no se le permitir conducir a casa. Necesitar que una persona responsable lo lleve a su casa y se quede con usted durante las 24 horas posteriores a la [place].  Si  viaja en transporte pblico, deber ir acompaado de una persona responsable.  Llame al Departamento de pruebas previas a la admisin al [phone] si tiene alguna pregunta sobre estas instrucciones.  Poltica de visitas a ciruga:  [employer] se someten a [place] o procedimiento pueden [employer] familiares o personas de apoyo con ellos, siempre y cuando la persona no sea positiva para COVID-19 ni experimente sus sntomas.  Visitas para pacientes hospitalizados:  El horario de visita es de 7 a 20 horas. Se permiten hasta cuatro visitantes a la vez en la habitacin de un paciente. Los visitantes podrn rotar con Garment/textile technologist. Neomia Dear persona de apoyo designada (adulto) podr pasar la noche.  Debido a un aumento en las tasas de VSR e influenza y las hospitalizaciones asociadas, los nios menores de 12 aos no podrn visitar a los pacientes en los hospitales de [employer]. Se siguen recomendando encarecidamente las mascarillas.  Preparacin para la ciruga con jabn de GLUCONATO DE CLORHEXIDINA (CHG)  Jabn de gluconato de clorhexidina (CHG)  o Un limpiador antisptico que [employer] grmenes y se adhiere a la piel para seguir [employer] grmenes incluso despus del lavado.  o Se utiliza para ducharse la noche anterior a la [place] y la maana de la [place].  Antes de la [place], usted puede desempear un papel importante al reducir la cantidad de grmenes en su piel. El jabn CHG (gluconato de clorhexidina) es un limpiador antisptico que mata los grmenes y se adhiere a la piel para continuar matndolos incluso despus del lavado.  No lo utilice si es alrgico al CHG o a los jabones antibacterianos. Si su piel se enrojece o irrita, deje de usar CHG.  1. Ducharse la NOCHE ANTES DE LA CIRUGA y la [place] DE LA CIRUGA con jabn CHG.  2. Si eliges lavarte el cabello, lvalo primero como de costumbre con tu champ habitual.  3. Despus del champ, enjuague bien  el cabello y el cuerpo para eliminar el champ.  4. Utilice CHG como lo hara con cualquier otro jabn lquido. Puede aplicar CHG directamente sobre la piel y lavar suavemente con un pauelo o una toallita limpia.  5. Aplique el jabn CHG en su cuerpo nicamente desde el cuello hacia abajo. No utilizar en heridas abiertas o llagas abiertas. Evite el contacto con los ojos, odos, boca y genitales (partes privadas). Lvese la cara y los genitales (partes privadas) con su jabn habitual.  6. Lvese bien, prestando especial atencin al rea donde se realizar su ciruga.  7. Enjuague bien su cuerpo con agua tibia.  8. No se duche ni se lave con su jabn normal despus de usar y enjuagar el jabn CHG.  9. Squese dando palmaditas con una toalla limpia.  10. Use pijamas limpios para dormir la noche anterior a la ciruga.  12. Coloque sbanas limpias en su cama la noche de su primera ducha y no duerma con mascotas.  13. Ducharse nuevamente con el jabn CHG el da de la ciruga antes de llegar al hospital.  14. No aplique desodorantes, lociones o polvos.  15. Por favor use ropa limpia al hospital.

## 2023-04-05 ENCOUNTER — Encounter: Payer: Self-pay | Admitting: General Surgery

## 2023-04-05 ENCOUNTER — Other Ambulatory Visit: Payer: Self-pay

## 2023-04-05 ENCOUNTER — Ambulatory Visit
Admission: RE | Admit: 2023-04-05 | Discharge: 2023-04-05 | Disposition: A | Discharge status: Home / Self Care | Attending: General Surgery | Admitting: General Surgery

## 2023-04-05 ENCOUNTER — Ambulatory Visit: Admitting: Certified Registered"

## 2023-04-05 ENCOUNTER — Encounter
Admission: RE | Disposition: A | Discharge status: Home / Self Care | Payer: Self-pay | Source: Home / Self Care | Attending: General Surgery

## 2023-04-05 DIAGNOSIS — N6041 Mammary duct ectasia of right breast: Secondary | ICD-10-CM | POA: Insufficient documentation

## 2023-04-05 DIAGNOSIS — Z01812 Encounter for preprocedural laboratory examination: Secondary | ICD-10-CM

## 2023-04-05 DIAGNOSIS — D241 Benign neoplasm of right breast: Secondary | ICD-10-CM | POA: Insufficient documentation

## 2023-04-05 DIAGNOSIS — Q838 Other congenital malformations of breast: Secondary | ICD-10-CM | POA: Insufficient documentation

## 2023-04-05 HISTORY — PX: RE-EXCISION OF BREAST LUMPECTOMY: SHX6048

## 2023-04-05 LAB — POCT PREGNANCY, URINE: Preg Test, Ur: NEGATIVE

## 2023-04-05 SURGERY — EXCISION, LESION, BREAST
Anesthesia: General | Site: Breast | Laterality: Right

## 2023-04-05 MED ORDER — ONDANSETRON HCL 4 MG/2ML IJ SOLN
INTRAMUSCULAR | Status: AC
  Filled 2023-04-05: qty 2

## 2023-04-05 MED ORDER — ONDANSETRON HCL 4 MG/2ML IJ SOLN: 4.0000 mg | Freq: Once | INTRAMUSCULAR | Status: DC | PRN

## 2023-04-05 MED ORDER — DEXMEDETOMIDINE HCL IN NACL 80 MCG/20ML IV SOLN
INTRAVENOUS | Status: DC | PRN
  Administered 2023-04-05: 8 ug via INTRAVENOUS

## 2023-04-05 MED ORDER — MIDAZOLAM HCL 2 MG/2ML IJ SOLN
INTRAMUSCULAR | Status: DC | PRN
  Administered 2023-04-05: 2 mg via INTRAVENOUS

## 2023-04-05 MED ORDER — HYDROCODONE-ACETAMINOPHEN 5-325 MG PO TABS: 1.0000 | ORAL_TABLET | Freq: Four times a day (QID) | ORAL | 0 refills | Status: AC | PRN

## 2023-04-05 MED ORDER — CHLORHEXIDINE GLUCONATE 0.12 % MT SOLN
15.0000 mL | Freq: Once | OROMUCOSAL | Status: AC
  Administered 2023-04-05: 15 mL via OROMUCOSAL

## 2023-04-05 MED ORDER — BUPIVACAINE-EPINEPHRINE (PF) 0.5% -1:200000 IJ SOLN
INTRAMUSCULAR | Status: DC | PRN
  Administered 2023-04-05: 20 mL

## 2023-04-05 MED ORDER — ORAL CARE MOUTH RINSE: 15.0000 mL | Freq: Once | OROMUCOSAL | Status: AC

## 2023-04-05 MED ORDER — OXYCODONE HCL 5 MG PO TABS
ORAL_TABLET | ORAL | Status: AC
  Filled 2023-04-05: qty 1

## 2023-04-05 MED ORDER — DEXAMETHASONE SODIUM PHOSPHATE 10 MG/ML IJ SOLN
INTRAMUSCULAR | Status: AC
  Filled 2023-04-05: qty 1

## 2023-04-05 MED ORDER — PROPOFOL 10 MG/ML IV BOLUS
INTRAVENOUS | Status: DC | PRN
  Administered 2023-04-05: 150 mg via INTRAVENOUS
  Administered 2023-04-05: 50 mg via INTRAVENOUS

## 2023-04-05 MED ORDER — LIDOCAINE HCL (CARDIAC) PF 100 MG/5ML IV SOSY
PREFILLED_SYRINGE | INTRAVENOUS | Status: DC | PRN
  Administered 2023-04-05: 60 mg via INTRAVENOUS

## 2023-04-05 MED ORDER — OXYCODONE HCL 5 MG/5ML PO SOLN: 5.0000 mg | Freq: Once | ORAL | Status: AC | PRN

## 2023-04-05 MED ORDER — FENTANYL CITRATE (PF) 100 MCG/2ML IJ SOLN
INTRAMUSCULAR | Status: AC
  Filled 2023-04-05: qty 2

## 2023-04-05 MED ORDER — LACTATED RINGERS IV SOLN: INTRAVENOUS | Status: DC | PRN

## 2023-04-05 MED ORDER — FENTANYL CITRATE (PF) 100 MCG/2ML IJ SOLN
INTRAMUSCULAR | Status: DC | PRN
  Administered 2023-04-05 (×2): 50 ug via INTRAVENOUS

## 2023-04-05 MED ORDER — BUPIVACAINE-EPINEPHRINE (PF) 0.5% -1:200000 IJ SOLN
INTRAMUSCULAR | Status: AC
  Filled 2023-04-05: qty 30

## 2023-04-05 MED ORDER — OXYCODONE HCL 5 MG PO TABS
5.0000 mg | ORAL_TABLET | Freq: Once | ORAL | Status: AC | PRN
  Administered 2023-04-05: 5 mg via ORAL

## 2023-04-05 MED ORDER — CEFAZOLIN SODIUM-DEXTROSE 2-4 GM/100ML-% IV SOLN
2.0000 g | INTRAVENOUS | Status: AC
Start: 2023-04-05 — End: 2023-04-05
  Administered 2023-04-05: 2 g via INTRAVENOUS

## 2023-04-05 MED ORDER — DEXAMETHASONE SODIUM PHOSPHATE 10 MG/ML IJ SOLN
INTRAMUSCULAR | Status: DC | PRN
  Administered 2023-04-05: 5 mg via INTRAVENOUS

## 2023-04-05 MED ORDER — ONDANSETRON HCL 4 MG/2ML IJ SOLN
INTRAMUSCULAR | Status: DC | PRN
Start: 2023-04-05 — End: 2023-04-05
  Administered 2023-04-05: 4 mg via INTRAVENOUS

## 2023-04-05 MED ORDER — CEFAZOLIN SODIUM-DEXTROSE 2-4 GM/100ML-% IV SOLN
INTRAVENOUS | Status: AC
Start: 2023-04-05 — End: ?
  Filled 2023-04-05: qty 100

## 2023-04-05 MED ORDER — MIDAZOLAM HCL 2 MG/2ML IJ SOLN
INTRAMUSCULAR | Status: AC
  Filled 2023-04-05: qty 2

## 2023-04-05 MED ORDER — ACETAMINOPHEN 10 MG/ML IV SOLN
INTRAVENOUS | Status: AC
  Filled 2023-04-05: qty 100

## 2023-04-05 MED ORDER — FENTANYL CITRATE (PF) 100 MCG/2ML IJ SOLN
25.0000 ug | INTRAMUSCULAR | Status: DC | PRN
  Administered 2023-04-05: 25 ug via INTRAVENOUS
  Administered 2023-04-05: 50 ug via INTRAVENOUS

## 2023-04-05 MED ORDER — KETOROLAC TROMETHAMINE 30 MG/ML IJ SOLN
INTRAMUSCULAR | Status: DC | PRN
  Administered 2023-04-05: 30 mg via INTRAVENOUS

## 2023-04-05 MED ORDER — CHLORHEXIDINE GLUCONATE 0.12 % MT SOLN
OROMUCOSAL | Status: AC
  Filled 2023-04-05: qty 15

## 2023-04-05 MED ORDER — LACTATED RINGERS IV SOLN: INTRAVENOUS | Status: DC

## 2023-04-05 MED ORDER — ACETAMINOPHEN 10 MG/ML IV SOLN
1000.0000 mg | Freq: Once | INTRAVENOUS | Status: DC | PRN
  Administered 2023-04-05: 1000 mg via INTRAVENOUS

## 2023-04-05 SURGICAL SUPPLY — 31 items
CHLORAPREP W/TINT 26 (MISCELLANEOUS) IMPLANT
DERMABOND ADVANCED .7 DNX12 (GAUZE/BANDAGES/DRESSINGS) ×1 IMPLANT
DERMABOND ADVANCED .7 DNX6 (GAUZE/BANDAGES/DRESSINGS) IMPLANT
DEVICE DUBIN SPECIMEN MAMMOGRA (MISCELLANEOUS) ×1 IMPLANT
DRAPE LAPAROTOMY TRNSV 106X77 (MISCELLANEOUS) ×1 IMPLANT
ELECT CAUTERY BLADE TIP 2.5 (TIP) ×1 IMPLANT
ELECT REM PT RETURN 9FT ADLT (ELECTROSURGICAL) ×1 IMPLANT
ELECTRODE CAUTERY BLDE TIP 2.5 (TIP) ×1 IMPLANT
ELECTRODE REM PT RTRN 9FT ADLT (ELECTROSURGICAL) ×1 IMPLANT
GLOVE BIO SURGEON STRL SZ 6.5 (GLOVE) ×1 IMPLANT
GLOVE BIOGEL PI IND STRL 6.5 (GLOVE) ×1 IMPLANT
GOWN STRL REUS W/ TWL LRG LVL3 (GOWN DISPOSABLE) ×3 IMPLANT
KIT MARKER MARGIN INK (KITS) IMPLANT
KIT TURNOVER KIT A (KITS) ×1 IMPLANT
LABEL OR SOLS (LABEL) ×1 IMPLANT
MANIFOLD NEPTUNE II (INSTRUMENTS) ×1 IMPLANT
MARKER MARGIN CORRECT CLIP (MARKER) IMPLANT
NDL HYPO 22X1.5 SAFETY MO (MISCELLANEOUS) ×1 IMPLANT
NEEDLE HYPO 22X1.5 SAFETY MO (MISCELLANEOUS) ×1 IMPLANT
PACK BASIN MINOR ARMC (MISCELLANEOUS) ×1 IMPLANT
RETRACTOR RING XSMALL (MISCELLANEOUS) IMPLANT
RTRCTR WOUND ALEXIS 13CM XS SH (MISCELLANEOUS) IMPLANT
SUT MNCRL 4-0 27 PS-2 XMFL (SUTURE) ×1 IMPLANT
SUT SILK 2 0 SH (SUTURE) IMPLANT
SUT VIC AB 3-0 SH 27X BRD (SUTURE) ×1 IMPLANT
SUTURE MNCRL 4-0 27XMF (SUTURE) ×1 IMPLANT
SYR 10ML LL (SYRINGE) ×1 IMPLANT
TRAP FLUID SMOKE EVACUATOR (MISCELLANEOUS) ×1 IMPLANT
TRAP NEPTUNE SPECIMEN COLLECT (MISCELLANEOUS) ×1 IMPLANT
WATER STERILE IRR 1000ML POUR (IV SOLUTION) ×1 IMPLANT
WATER STERILE IRR 500ML POUR (IV SOLUTION) ×1 IMPLANT

## 2023-04-05 NOTE — Discharge Instructions (Addendum)
  Diet: Resume home heart healthy regular diet.   Activity: No heavy lifting >20 pounds (children, pets, laundry, garbage) or strenuous activity until follow-up, but light activity and walking are encouraged. Do not drive or drink alcohol if taking narcotic pain medications.  Do not elevate your right arm more than 90 degrees until follow up.   Wound care: May shower with soapy water and pat dry (do not rub incisions), but no baths or submerging incision underwater until follow-up. (no swimming)   Medications: Resume all home medications. For mild to moderate pain: acetaminophen (Tylenol) or ibuprofen (if no kidney disease). Combining Tylenol with alcohol can substantially increase your risk of causing liver disease. Narcotic pain medications, if prescribed, can be used for severe pain, though may cause nausea, constipation, and drowsiness. Do not combine Tylenol and Norco within a 6 hour period as Norco contains Tylenol. If you do not need the narcotic pain medication, you do not need to fill the prescription.  Call office [phone]) at any time if any questions, worsening pain, fevers/chills, bleeding, drainage from incision site, or other concerns.

## 2023-04-05 NOTE — Anesthesia Preprocedure Evaluation (Signed)
 Anesthesia Evaluation  Patient identified by MRN, date of birth, ID band Patient awake    Reviewed: Allergy & Precautions, NPO status , Patient's Chart, lab work & pertinent test results  History of Anesthesia Complications Negative for: history of anesthetic complications  Airway Mallampati: II  TM Distance: >3 FB Neck ROM: Full    Dental no notable dental hx. (+) Teeth Intact   Pulmonary asthma , neg sleep apnea, neg COPD, Patient abstained from smoking.Not current smoker, former smoker   Pulmonary exam normal breath sounds clear to auscultation       Cardiovascular Exercise Tolerance: Good METS(-) hypertension(-) CAD and (-) Past MI negative cardio ROS (-) dysrhythmias  Rhythm:Regular Rate:Normal - Systolic murmurs    Neuro/Psych negative neurological ROS  negative psych ROS   GI/Hepatic ,neg GERD  ,,(+)     (-) substance abuse    Endo/Other  neg diabetes    Renal/GU negative Renal ROS     Musculoskeletal   Abdominal  (+) + obese  Peds  Hematology   Anesthesia Other Findings Past Medical History: No date: Asthma No date: Medical history non-contributory  Reproductive/Obstetrics                             Anesthesia Physical Anesthesia Plan  ASA: 2  Anesthesia Plan: General   Post-op Pain Management: Ofirmev IV (intra-op)* and Toradol IV (intra-op)*   Induction: Intravenous  PONV Risk Score and Plan: 3 and Ondansetron, Dexamethasone and Midazolam  Airway Management Planned: LMA  Additional Equipment: None  Intra-op Plan:   Post-operative Plan: Extubation in OR  Informed Consent: I have reviewed the patients History and Physical, chart, labs and discussed the procedure including the risks, benefits and alternatives for the proposed anesthesia with the patient or authorized representative who has indicated his/her understanding and acceptance.     Dental advisory  given  Plan Discussed with: CRNA and Surgeon  Anesthesia Plan Comments: (Discussed risks of anesthesia with patient, including PONV, sore throat, lip/dental/eye damage. Rare risks discussed as well, such as cardiorespiratory and neurological sequelae, and allergic reactions. Discussed the role of CRNA in patient's perioperative care. Patient understands.)       Anesthesia Quick Evaluation

## 2023-04-05 NOTE — Anesthesia Postprocedure Evaluation (Signed)
 Anesthesia Post Note  Patient: Stacey Wilcox  Procedure(s) Performed: EXCISION, LESION, BREAST (Right: Breast)  Patient location during evaluation: PACU Anesthesia Type: General Level of consciousness: awake and alert Pain management: pain level controlled Vital Signs Assessment: post-procedure vital signs reviewed and stable Respiratory status: spontaneous breathing, nonlabored ventilation, respiratory function stable and patient connected to nasal cannula oxygen Cardiovascular status: blood pressure returned to baseline and stable Postop Assessment: no apparent nausea or vomiting Anesthetic complications: no   No notable events documented.   Last Vitals:  Vitals:   04/05/23 1215 04/05/23 1230  BP: 120/60 112/64  Pulse: 76 64  Resp: 14 11  Temp: (!) 36.2 C   SpO2: 100% 98%    Last Pain:  Vitals:   04/05/23 1215  TempSrc:   PainSc: Asleep                 Corinda Gubler

## 2023-04-05 NOTE — Anesthesia Procedure Notes (Signed)
 Procedure Name: LMA Insertion Date/Time: 04/05/2023 10:44 AM  Performed by: Monico Hoar, CRNAPre-anesthesia Checklist: Patient identified, Patient being monitored, Timeout performed, Emergency Drugs available and Suction available Patient Re-evaluated:Patient Re-evaluated prior to induction Oxygen Delivery Method: Circle system utilized Preoxygenation: Pre-oxygenation with 100% oxygen Induction Type: IV induction Ventilation: Mask ventilation without difficulty LMA: LMA inserted LMA Size: 4.0 Tube type: Oral Number of attempts: 1 Placement Confirmation: positive ETCO2 and breath sounds checked- equal and bilateral Tube secured with: Tape Dental Injury: Teeth and Oropharynx as per pre-operative assessment

## 2023-04-05 NOTE — Transfer of Care (Signed)
 Immediate Anesthesia Transfer of Care Note  Patient: Stacey Wilcox  Procedure(s) Performed: EXCISION, LESION, BREAST (Right: Breast)  Patient Location: PACU  Anesthesia Type:General  Level of Consciousness: drowsy  Airway & Oxygen Therapy: Patient Spontanous Breathing and Patient connected to face mask oxygen  Post-op Assessment: Report given to RN and Post -op Vital signs reviewed and stable  Post vital signs: Reviewed and stable  Last Vitals:  Vitals Value Taken Time  BP 106/65 04/05/23 1152  Temp 36.7 C 04/05/23 1152  Pulse 94 04/05/23 1157  Resp 17 04/05/23 1157  SpO2 99 % 04/05/23 1157  Vitals shown include unfiled device data.  Last Pain:  Vitals:   04/05/23 1152  TempSrc:   PainSc: Asleep         Complications: No notable events documented.

## 2023-04-05 NOTE — Op Note (Signed)
 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 Hazle Quant   Indication: This 28 y.o. year old female with a soft tissue on the right axillary consistent with aberrant breast tissue mass that is causing discomfort and increasing in size.    Description of procedure: after orienting patient about the procedure steps and benefits and patient agreed to proceed. Time out was done identifying correct patient and location of procedure. After induction of general anesthesia, local anesthesia was infiltrated around the palpable mass. With a blade #15, an elliptical incision was made using the skin lines. Dissection was carried down around the ectopic breast tissue and mass was excised completely. The mass measured 11 x 9 cm. Deep dermal stitches were done with vicryl 4-0 to repair the laceration and skin closed with Monocryl 4-0 in subcuticular fashion. Specimen sent to pathology.    Complications: none   EBL: minimal  Stacey Shiver, MD, FACS

## 2023-04-05 NOTE — Interval H&P Note (Signed)
 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  The various methods of treatment have been discussed with the patient and family. After consideration of risks, benefits and other options for treatment, the patient has consented to  Procedure(s): EXCISION, LESION, BREAST (Right) as a surgical intervention.  The patient's history has been reviewed, patient examined, no change in status, stable for surgery.  I have reviewed the patient's chart and labs.  Questions were answered to the patient's satisfaction.     Carolan Shiver

## 2023-04-06 ENCOUNTER — Encounter: Payer: Self-pay | Admitting: General Surgery

## 2023-04-08 LAB — SURGICAL PATHOLOGY

## 2023-04-13 DIAGNOSIS — Z419 Encounter for procedure for purposes other than remedying health state, unspecified: Secondary | ICD-10-CM | POA: Diagnosis not present

## 2023-06-11 ENCOUNTER — Ambulatory Visit (LOCAL_COMMUNITY_HEALTH_CENTER): Payer: Self-pay | Admitting: Nurse Practitioner

## 2023-06-11 VITALS — BP 130/80 | HR 65 | Wt 231.2 lb

## 2023-06-11 DIAGNOSIS — Z3042 Encounter for surveillance of injectable contraceptive: Secondary | ICD-10-CM

## 2023-06-11 DIAGNOSIS — Z30013 Encounter for initial prescription of injectable contraceptive: Secondary | ICD-10-CM

## 2023-06-11 DIAGNOSIS — Z3009 Encounter for other general counseling and advice on contraception: Secondary | ICD-10-CM

## 2023-06-11 MED ORDER — MEDROXYPROGESTERONE ACETATE 150 MG/ML IM SUSP
150.0000 mg | INTRAMUSCULAR | Status: DC
  Administered 2023-06-11: 150 mg via INTRAMUSCULAR

## 2023-06-11 NOTE — Progress Notes (Signed)
 Pt is here for an Acute visit and Depo injection. Injection given today at the [facility] and pt tolerated well to injection. Reminder card given. Austine Lefort, RN.

## 2023-06-23 ENCOUNTER — Encounter: Payer: Self-pay | Admitting: Nurse Practitioner

## 2023-06-23 NOTE — Progress Notes (Signed)
 [facility]PART[facility] [address] [place] [address] Main phone: [phone]  Family Planning Visit - Repeat Yearly Visit  Subjective:  Stacey Wilcox is a 28 y.o. G3P2002  being seen today to discuss contraception options. The patient is currently using hormonal injection for pregnancy prevention and desires Nexplanon insertion today. Patient does not want a pregnancy in the next year.   Patient reports they are looking for a method with the following characteristics:  High efficacy at preventing pregnancy Long term method Method that does not involve too much memory  Patient has the following medical problems:  Patient Active Problem List   Diagnosis Date Noted   Obesity 12/20/2022   Abnormal Pap smear of cervix 12/20/2022   Hypertension in pregnancy, preeclampsia, delivered 12/20/2022   Chief Complaint  Patient presents with   Acute Visit    Pt is here for an Acute visit and BC change    HPI Patient is a 28 y.o. female who presents to the office today requesting Nexplanon insertion. Patient was last seen in this office for hormonal injection 12/2022. She desired hormonal implant at that time, but had been practicing unprotected sex and was given hormonal injection instead. Patient was told at that time to return in 2 weeks and if not having unprotected sex and negative PT she could have Nexplanon inserted. Patient did not return. Instead, patient was seen at [facility] and received hormonal injection 03/11/23, per patient. Patient has no proof of hormonal injection with her today at outside clinic with her today. Patient declines symptoms today. Patient indicates 1 female partner in the last 2 months. Patient reports last sex was 3 days prior without a condom. She indicates use of hormonal injection.  Patient indicates LMP was last month, but she is unsure when and reports they are  irregular.   See flowsheet for further details and programmatic requirements Hyperlink available at the top of the signed note in blue.  Flow sheet content below:  Pregnancy Intention Screening Does the patient want to become pregnant in the next year?: No Does the patient's partner want to become pregnant in the next year?: No Would the patient like to discuss contraceptive options today?: Yes Contraception Wrap Up Current Method: Hormonal Injection End Method: Hormonal Injection Contraception Counseling Provided: Yes How was the end contraceptive method provided?: Provided on site Interpreter Interpreters used: Gladies Teixeria   STI screening Patient reports 1 of partners in last year.  Does this patient desire STI screening?  No - patient declines today.  Hepatitis C screening Has patient been screened once for HCV in the past?  No  No results found for: HCVAB  Cervical Cancer Screening  Result Date Procedure Results Follow-ups  12/20/2022 IGP, rfx Aptima HPV ASCU DIAGNOSIS:: Comment Specimen adequacy:: Comment Clinician Provided ICD10: Comment Performed by:: Comment QC reviewed by:: Comment PAP Smear Comment: . Note:: Comment Test Methodology: Comment PAP Reflex: Comment     Health Maintenance Due  Topic Date Due   DTaP/Tdap/Td (1 - Tdap) Never done   COVID-19 Vaccine (1 - 2024-25 season) Never done   HPV VACCINES (1 - 3-dose SCDM series) Never done    The following portions of the patient's history were reviewed and updated as appropriate: allergies, current medications, past family history, past medical history, past social history, past surgical history and problem list. Problem list updated.  Objective:   Vitals:   06/11/23 1629  BP: 130/80  Pulse: 65  Weight: 231 lb 3.2 oz (104.9 kg)    Physical Exam Nursing note reviewed.  Constitutional:      General: She is not in acute distress.    Appearance: Normal appearance.  HENT:     Mouth/Throat:      Lips: Pink. No lesions.   Eyes:     General: No scleral icterus.       Right eye: No discharge.        Left eye: No discharge.     Conjunctiva/sclera:     Right eye: Right conjunctiva is not injected. No exudate or hemorrhage.    Left eye: Left conjunctiva is not injected. No exudate or hemorrhage.  Pulmonary:     Effort: Pulmonary effort is normal.  Genitourinary:    Comments: Declined genital exam.   Skin:    Comments: Skin tone appropriate for ethnicity. Assessed exposed areas only.     Neurological:     Mental Status: She is alert and oriented to person, place, and time.   Psychiatric:        Attention and Perception: Attention and perception normal.        Mood and Affect: Mood and affect normal.        Speech: Speech normal.        Behavior: Behavior normal. Behavior is cooperative.        Thought Content: Thought content normal.     Assessment and Plan:  Stacey Wilcox is a 28 y.o. female G3P2002 presenting to the [facility] Department for a contraception visit.  1. Encounter for Depo-Provera  contraception (Primary) Contraception counseling:  Reviewed options based on patient desire and reproductive life plan. Patient is interested in Hormonal Implant. This was not provided to the patient today. Patient cannot provide proof of recent hormonal injection and reports sex without a condom.  Gave patient the option to return to clinic within the next 2 weeks to have hormonal implant inserted if she can provide proof of hormonal injection within the last 15 weeks. Patient declined and stated she would prefer to have hormonal injection today and return for Nexplanon later.   Risks, benefits, and typical effectiveness rates were reviewed.  Questions were answered.  Written information was also given to the patient to review.    The patient will follow up in  3 months for surveillance.  The patient was told to call with any further questions, or with  any concerns about this method of contraception.  Emphasized use of condoms 100% of the time for STI prevention.  Emergency Contraception Precautions (ECP): Patient assessed for need of ECP. She is not a candidate based on depo provera  within recommended dates.   - medroxyPROGESTERone  (DEPO-PROVERA ) injection 150 mg  Return in about 3 months (around 09/11/2023) for depo injection.  No future appointments.  Due to language barrier, a Spanish interpreter Kemp T.) was present in person during the history-taking, subsequent discussion, and physical exam with this patient.   Clarita LITTIE Narrow, NP

## 2023-08-23 ENCOUNTER — Ambulatory Visit: Payer: Self-pay | Admitting: Nurse Practitioner

## 2023-08-23 VITALS — BP 119/76 | HR 72 | Ht 64.0 in | Wt 231.8 lb

## 2023-08-23 DIAGNOSIS — Z3009 Encounter for other general counseling and advice on contraception: Secondary | ICD-10-CM

## 2023-08-23 DIAGNOSIS — Z30017 Encounter for initial prescription of implantable subdermal contraceptive: Secondary | ICD-10-CM

## 2023-08-23 DIAGNOSIS — Z3202 Encounter for pregnancy test, result negative: Secondary | ICD-10-CM

## 2023-08-23 DIAGNOSIS — R1031 Right lower quadrant pain: Secondary | ICD-10-CM

## 2023-08-23 DIAGNOSIS — R6889 Other general symptoms and signs: Secondary | ICD-10-CM

## 2023-08-23 LAB — PREGNANCY, URINE: Preg Test, Ur: NEGATIVE

## 2023-08-23 MED ORDER — ETONOGESTREL 68 MG ~~LOC~~ IMPL
68.0000 mg | DRUG_IMPLANT | Freq: Once | SUBCUTANEOUS | Status: AC
  Administered 2023-08-23: 68 mg via SUBCUTANEOUS

## 2023-08-23 NOTE — Progress Notes (Signed)
 [facility]PART[facility] [address] [place] [address] Main phone: [phone]  Family Planning Visit - Repeat Yearly Visit  Subjective:  Stacey Wilcox is a 28 y.o. G3P2002  being seen today for an annual wellness visit and to discuss contraception options. The patient is currently using hormonal injection for pregnancy prevention. Patient does not want a pregnancy in the next year.   Patient reports they are looking for a method with the following characteristics:  Method that does not involve too much memory  Desires Nexplanon .  Patient has the following medical problems:  Patient Active Problem List   Diagnosis Date Noted   Obesity 12/20/2022   Abnormal Pap smear of cervix 12/20/2022   Hypertension in pregnancy, preeclampsia, delivered 12/20/2022   Chief Complaint  Patient presents with   Acute Visit   HPI Patient reports headache, fatigue, dizzy when laying down, lower abdominal pain, spotting when wiping once. Has felt unwell for 2 weeks.  Felt feverish on Wednesday, had chills. No vomiting, diarrhea. No dysuria. Did take ibuprofen  all last week for headache. When she was taking a lot of ibuprofen , she felt she had odor in her urine, burning when she peed. The urinary symptoms have gone away.  Appetite okay, hydrating.  Headache today on forehead, around eyes.   Reports she feels well enough to get her Nexplanon  placed today.  See flowsheet for further details and programmatic requirements Hyperlink available at the top of the signed note in blue.  Flow sheet content below:  Pregnancy Intention Screening Does the patient want to become pregnant in the next year?: No Does the patient's partner want to become pregnant in the next year?: No Would the patient like to discuss contraceptive options today?: Yes Contraception Wrap Up Current Method: Hormonal Injection End Method: Hormonal  Implant Contraception Counseling Provided: Yes How was the end contraceptive method provided?: Provided on site Interpreter Interpreters used: Irina Teixeria  Cervical Cancer Screening  Result Date Procedure Results Follow-ups  12/20/2022 IGP, rfx Aptima HPV ASCU DIAGNOSIS:: Comment Specimen adequacy:: Comment Clinician Provided ICD10: Comment Performed by:: Comment QC reviewed by:: Comment PAP Smear Comment: . Note:: Comment Test Methodology: Comment PAP Reflex: Comment     Health Maintenance Due  Topic Date Due   DTaP/Tdap/Td (1 - Tdap) Never done   Hepatitis B Vaccines 19-59 Average Risk (1 of 3 - 19+ 3-dose series) Never done   COVID-19 Vaccine (1 - 2024-25 season) Never done   HPV VACCINES (1 - 3-dose SCDM series) Never done   INFLUENZA VACCINE  08/02/2023   The following portions of the patient's history were reviewed and updated as appropriate: allergies, current medications, past family history, past medical history, past social history, past surgical history and problem list. Problem list updated.  Objective:   Vitals:   08/23/23 1541  BP: 119/76  Pulse: 72  Weight: 231 lb 12.8 oz (105.1 kg)  Height: 5' 4 (1.626 m)   Physical Exam Constitutional:      Appearance: Normal appearance.  HENT:     Head: Normocephalic.     Mouth/Throat:     Mouth: Mucous membranes are moist.     Pharynx: Oropharynx is clear. No pharyngeal swelling, oropharyngeal exudate or posterior oropharyngeal erythema.  Eyes:     General: No scleral icterus.       Right eye: No discharge.        Left eye: No discharge.  Pulmonary:     Effort: Pulmonary  effort is normal.  Abdominal:     General: Abdomen is flat.     Palpations: Abdomen is soft.     Tenderness: There is abdominal tenderness in the right lower quadrant and left lower quadrant. There is no rebound.  Lymphadenopathy:     Cervical: No cervical adenopathy.  Skin:    General: Skin is warm and dry.  Neurological:     Mental  Status: She is alert.  Psychiatric:        Mood and Affect: Mood normal.        Behavior: Behavior normal.    Assessment and Plan:  Stacey Wilcox is a 28 y.o. female G3P2002 presenting to the [facility] Department for an yearly wellness and contraception visit  Contraception counseling:  Reviewed options based on patient desire and reproductive life plan. Patient is interested in Hormonal Implant. This was provided to the patient today.  Risks, benefits, and typical effectiveness rates were reviewed.  Questions were answered.  Written information was also given to the patient to review.    The patient was told to call with any further questions, or with any concerns about this method of contraception.  Emphasized use of condoms 100% of the time for STI prevention.  Emergency Contraception Precautions (ECP): Patient assessed for need of ECP. She is not a candidate based on depo provera  within recommended dates.   1. Lower quadrant abdominal pain (Primary)  - Pregnancy, urine negative  - No rebound tenderness, abdomen soft, no guarding  2. Multiple complaints  - May have viral illness, not acutely ill or requiring emergency care - Referred to PCP/urgent care for evaluation  3. Encounter for initial prescription of implantable subdermal contraceptive Procedure:  Nexplanon  Insertion  Patient identified, informed consent performed, consent signed.   Patient does understand that irregular bleeding is a very common side effect of this medication. She was advised to have backup contraception after placement. Patient was determined to meet WHO criteria for not being pregnant. Appropriate time out taken.  The insertion site was identified 8-10 cm (3-4 inches) from the medial epicondyle of the humerus and 3-5 cm (1.25-2 inches) posterior to (below) the sulcus (groove) between the biceps and triceps muscles of the patient's right arm and marked.  Patient was prepped with  alcohol swab and then injected with 3 ml of 1% lidocaine .  Arm was prepped with chlorhexidene, Nexplanon  removed from packaging,  Device confirmed in needle, then inserted full length of needle and withdrawn per handbook instructions. Nexplanon  was able to palpated in the patient's arm; patient declined to palpate the insert herself. There was minimal blood loss.  Patient insertion site covered with steristrips then guaze and a pressure bandage to reduce any bruising.  The patient tolerated the procedure well and was given post procedure instructions. Inserted by Damien FORBES Satchel, NP  - etonogestrel  (NEXPLANON ) implant 68 mg  Return if symptoms worsen or fail to improve.  No future appointments.  Damien FORBES Satchel, NP  Due to language barrier, a Spanish interpreter Kemp T.) was present in person during the history-taking, subsequent discussion, and physical exam with this patient.

## 2023-08-23 NOTE — Progress Notes (Signed)
 PT negative. Nexplanon  card and aftercare instructions given. Larraine JONELLE Northern, RN

## 2024-01-10 ENCOUNTER — Ambulatory Visit: Payer: Self-pay

## 2024-01-10 VITALS — BP 104/65 | HR 79 | Ht 64.0 in | Wt 234.6 lb

## 2024-01-10 DIAGNOSIS — Z3049 Encounter for surveillance of other contraceptives: Secondary | ICD-10-CM

## 2024-01-10 DIAGNOSIS — Z3009 Encounter for other general counseling and advice on contraception: Secondary | ICD-10-CM

## 2024-01-10 DIAGNOSIS — Z30013 Encounter for initial prescription of injectable contraceptive: Secondary | ICD-10-CM

## 2024-01-10 DIAGNOSIS — Z3046 Encounter for surveillance of implantable subdermal contraceptive: Secondary | ICD-10-CM

## 2024-01-10 DIAGNOSIS — Z3042 Encounter for surveillance of injectable contraceptive: Secondary | ICD-10-CM

## 2024-01-10 MED ORDER — MEDROXYPROGESTERONE ACETATE 150 MG/ML IM SUSP
150.0000 mg | INTRAMUSCULAR | Status: AC
  Administered 2024-01-10: 150 mg via INTRAMUSCULAR

## 2024-01-10 NOTE — Progress Notes (Signed)
 " [facility] [address] [place] [address] Main phone: [phone]  Women's Health Problem Visit   Subjective:  Stacey Wilcox is a 29 y.o. being seen today for contraception visit.   Chief Complaint  Patient presents with   Contraception   HPI: Patient reports side effects with the implant - she is having lots of headaches, nausea, feels ill after eating.  Headaches are occurring daily/middle of the night. The headaches are waking her up. These symptoms started ever since the insertion.  She would like to return to [facility].   Health Maintenance Due  Topic Date Due   DTaP/Tdap/Td (1 - Tdap) Never done   Hepatitis B Vaccines 19-59 Average Risk (1 of 3 - 19+ 3-dose series) Never done   HPV VACCINES (1 - 3-dose SCDM series) Never done   Influenza Vaccine  08/02/2023   COVID-19 Vaccine (1 - 2025-26 season) Never done   Review of Systems  Gastrointestinal:  Positive for nausea.  Neurological:  Positive for headaches.  All other systems reviewed and are negative.  The following portions of the patient's history were reviewed and updated as appropriate: allergies, current medications, past family history, past medical history, past social history, past surgical history and problem list. Problem list updated.  See flowsheet for other program required questions.  Objective:   Vitals:   01/10/24 1345  BP: 104/65  Pulse: 79  Weight: 234 lb 9.6 oz (106.4 kg)  Height: 5' 4 (1.626 m)   Physical Exam Constitutional:      Appearance: Normal appearance.  HENT:     Head: Normocephalic.     Mouth/Throat:     Mouth: Mucous membranes are moist.  Eyes:     General: No scleral icterus.       Right eye: No discharge.        Left eye: No discharge.  Pulmonary:     Effort: Pulmonary effort is normal.  Skin:    General: Skin is warm and dry.  Neurological:     General: No focal deficit present.      Mental Status: She is alert.  Psychiatric:        Mood and Affect: Mood normal.        Behavior: Behavior normal.     Assessment and Plan:  Stacey Wilcox is a 29 y.o. female presenting to the [facility] for a Women's Health problem visit  1. Nexplanon  removal (Primary)  Procedure:  Nexplanon  Removal Patient identified, informed consent performed, consent signed.   Appropriate time out taken. Nexplanon  site identified in the patient's left arm.  Area prepped in usual sterile fashon. 1 ml of 1% lidocaine  with Epinephrine  was used to anesthetize the area at the distal end of the implant and along implant site. A small stab incision was made right beside the implant on the distal portion.  The Nexplanon  rod was grasped using hemostats and removed without difficulty.  There was minimal blood loss. There were no complications.  Steri-strips were applied over the small incision.  A pressure bandage was applied to reduce any bruising.  The patient tolerated the procedure well and was given post procedure instructions.     Nexplanon :   Counseled patient to take OTC analgesic starting as soon as lidocaine  starts to wear off and take regularly for at least 48 hr to decrease discomfort.  Specifically to take with food or milk to decrease stomach upset and for  IB 600 mg (3 tablets) every 6 hrs; IB 800 mg (4 tablets) every 8 hrs; or Aleve 2 tablets every 12 hrs.    2. Encounter for Depo-Provera  contraception  - medroxyPROGESTERone  (DEPO-PROVERA ) injection 150 mg   Due to language barrier, a Spanish interpreter Emmie N.) was present in person during the history-taking, subsequent discussion, and physical exam with this patient.    Return in about 7 months (around 08/09/2024).  No future appointments.  Damien FORBES Satchel, NP "

## 2024-01-10 NOTE — Progress Notes (Signed)
 Pt is here for Nexplanon  removal. Nexplanon  removed successfully from the Rt Arm by Damien Satchel, [facility]. And pt tolerated well to the removal process with no complications. IM depo given at the Lt Deltoid. Opportunity given to pt to ask questions for any clarifications. Questions Answered. Reminder card given and Condoms declined. Wilkie Drought, RN.

## 2024-04-14 IMAGING — US US OB COMP LESS 14 WK
1 series · 14 of 28 positions shown · non-contrast
Comparison: None.

CLINICAL DATA: Recent fall with pelvic pain, positive pregnancy
test, initial encounter

EXAM:
OBSTETRIC <14 WK ULTRASOUND
TECHNIQUE: Transabdominal ultrasound was performed for evaluation of the
gestation as well as the maternal uterus and adnexal regions.

[Series 1: us ob comp less 14 wks · 101 acquisitions, 14 frames shown]
[im 4/101]
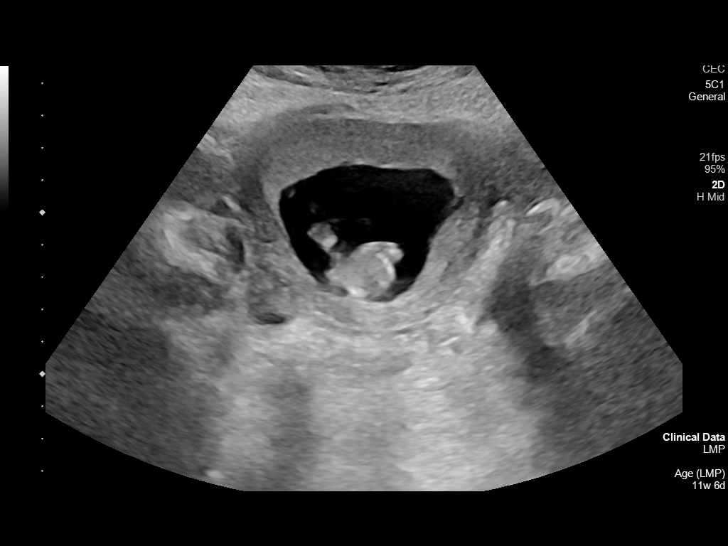
[im 12/101]
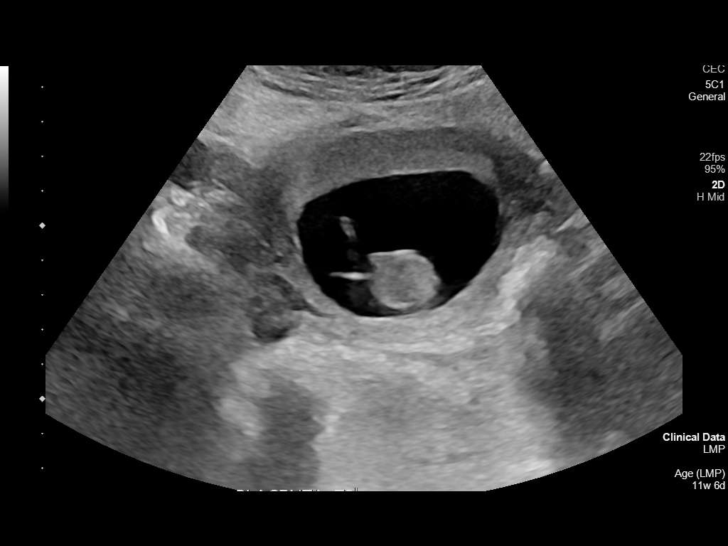
[im 19/101]
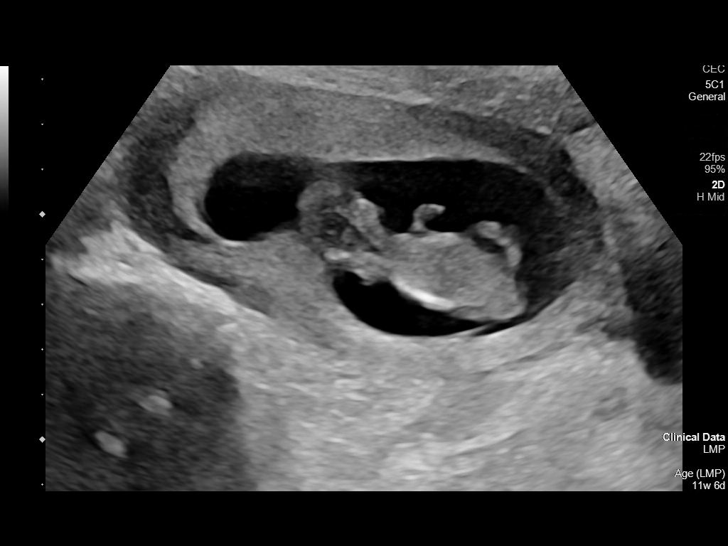
[im 26/101]
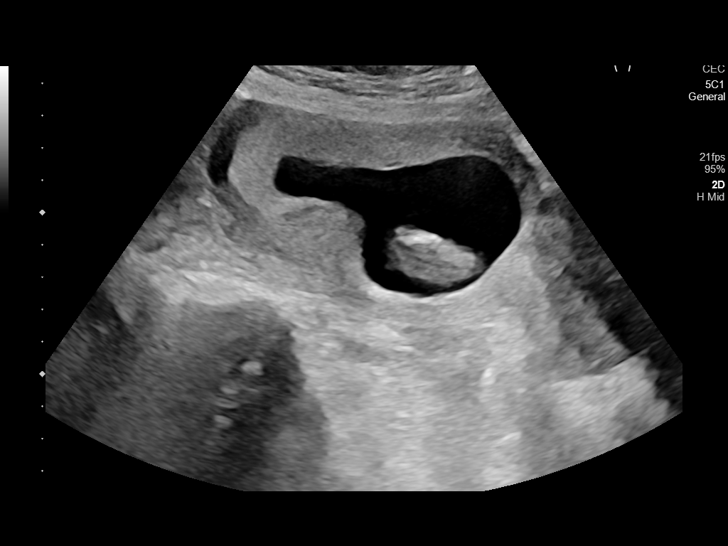
[im 34/101]
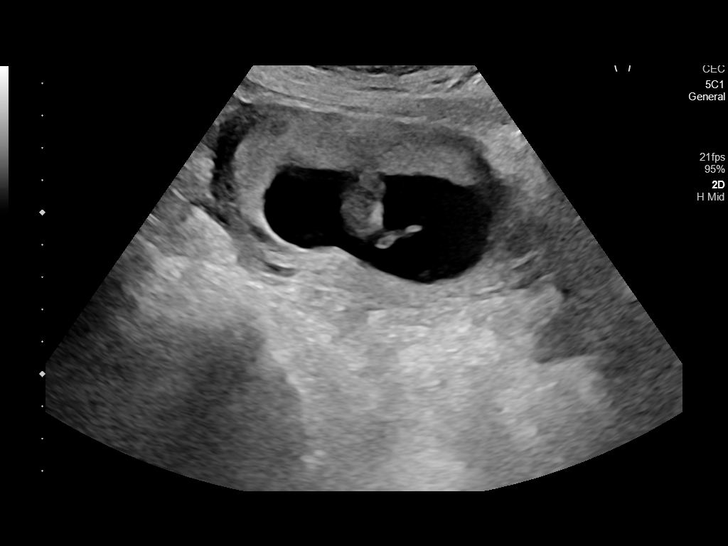
[im 41/101]
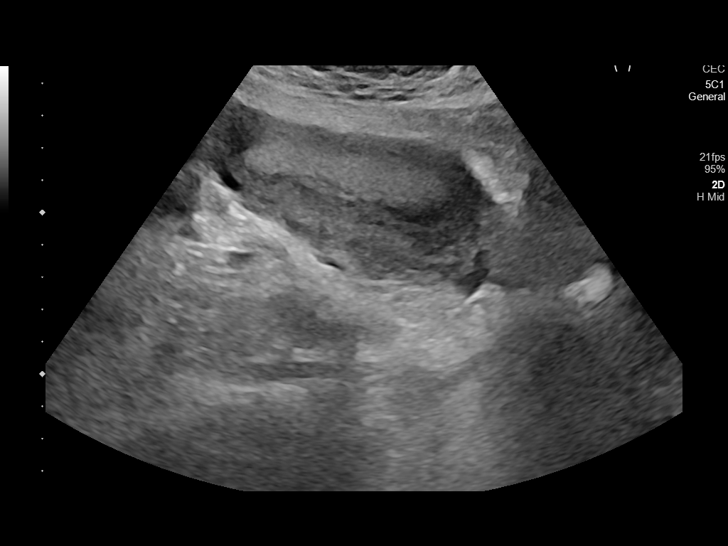
[im 49/101]
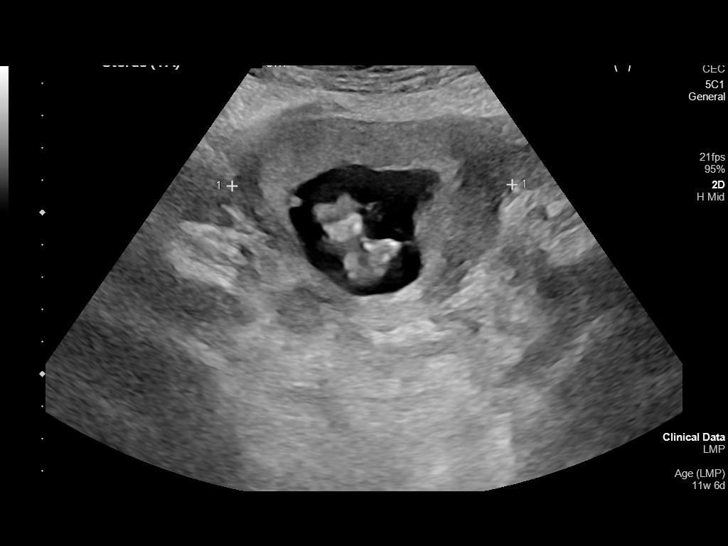
[im 56/101]
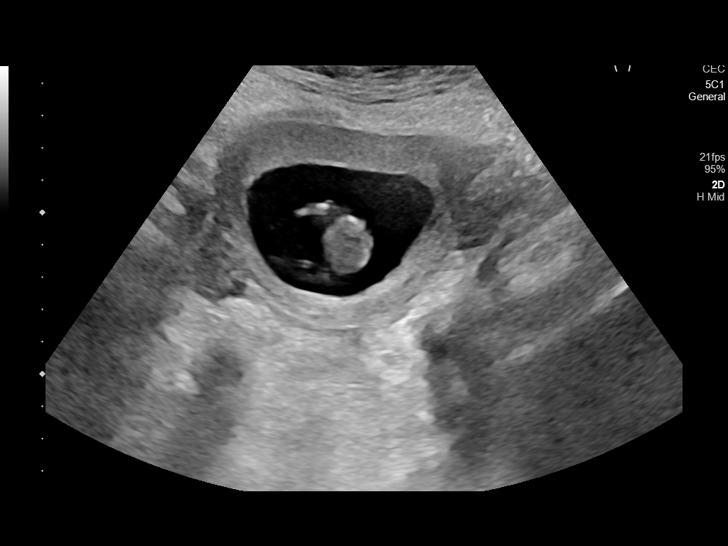
[im 63/101]
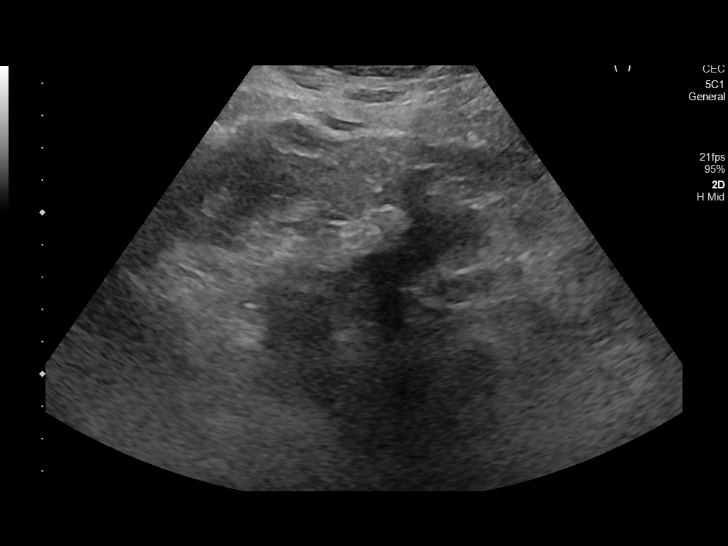
[im 71/101]
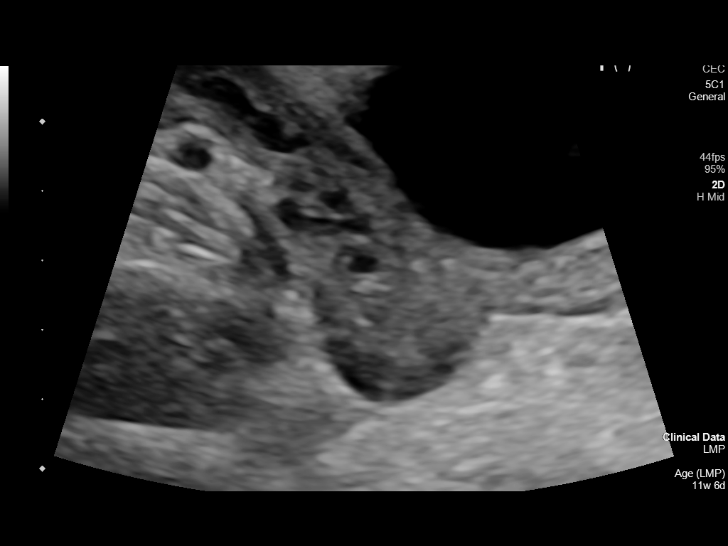
[im 78/101]
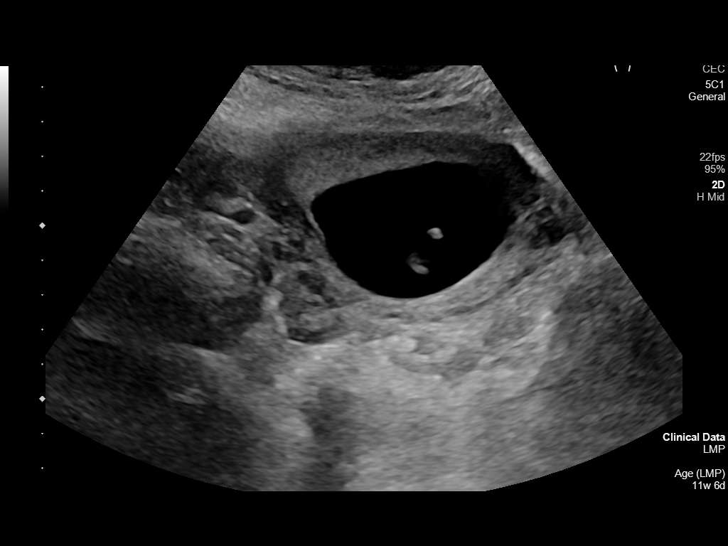
[im 86/101]
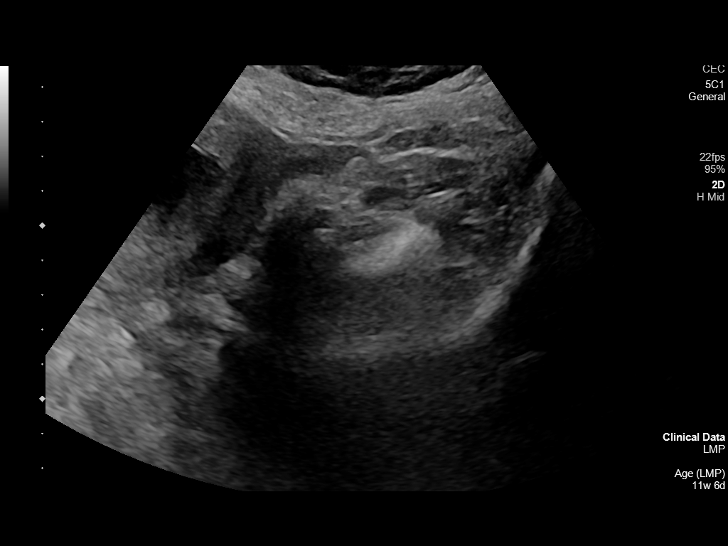
[im 93/101]
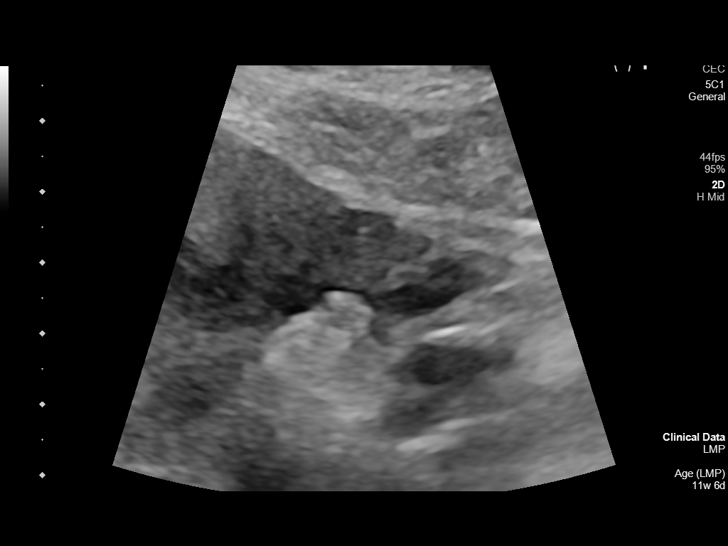
[im 101/101]
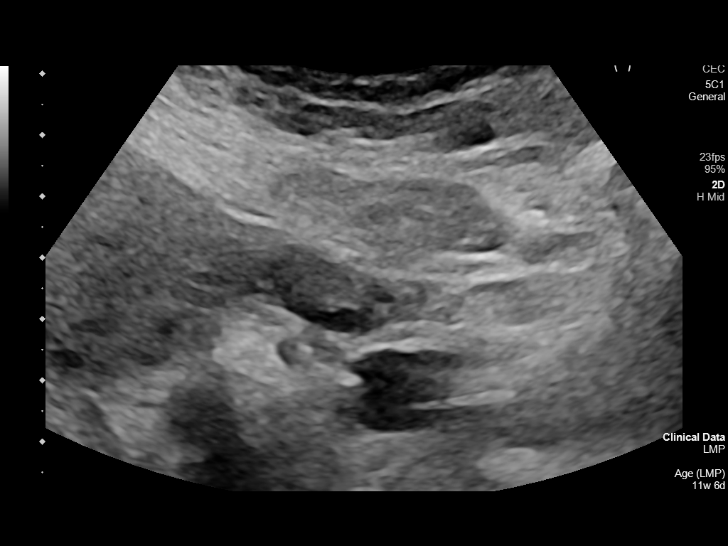

[14 of 28 positions shown; findings below may reference images not displayed]

FINDINGS: Intrauterine gestational sac: Present

Yolk sac:  Absent

Embryo:  Present

Cardiac Activity:

Heart Rate: 164 bpm

CRL:   50 mm   11 w 5 d                  US EDC: 11/13/2021

Subchorionic hemorrhage:  None visualized.

Maternal uterus/adnexae: Ovaries are within normal limits
IMPRESSION: Single live intrauterine gestation at 11 weeks 5 days. No acute
abnormality noted.

## 2024-04-14 IMAGING — CR DG LUMBAR SPINE 2-3V
1 series · 2 of 2 positions shown · non-contrast
Comparison: None.

CLINICAL DATA: Fall today with low back pain. Pregnant patient in
first-trimester pregnancy, pregnancy waiver signed by patient.

EXAM:
LUMBAR SPINE - 2-3 VIEW

[Series 1: dg lumbar spine complete 4 +v · 0.14mm/px · 2 of 2 slices shown]
[im 1/2]
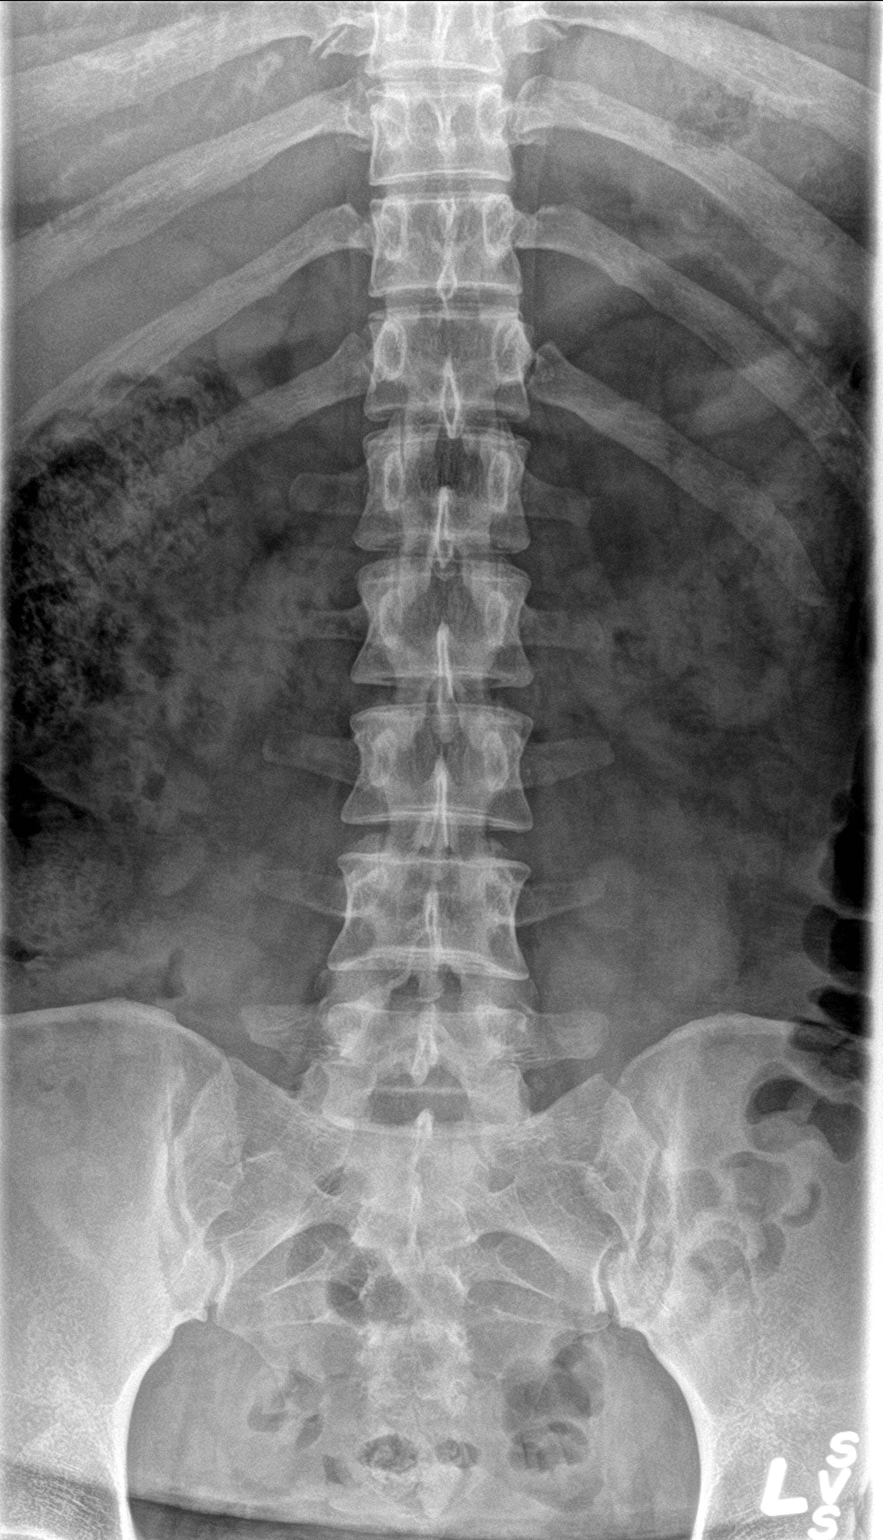
[im 2/2]
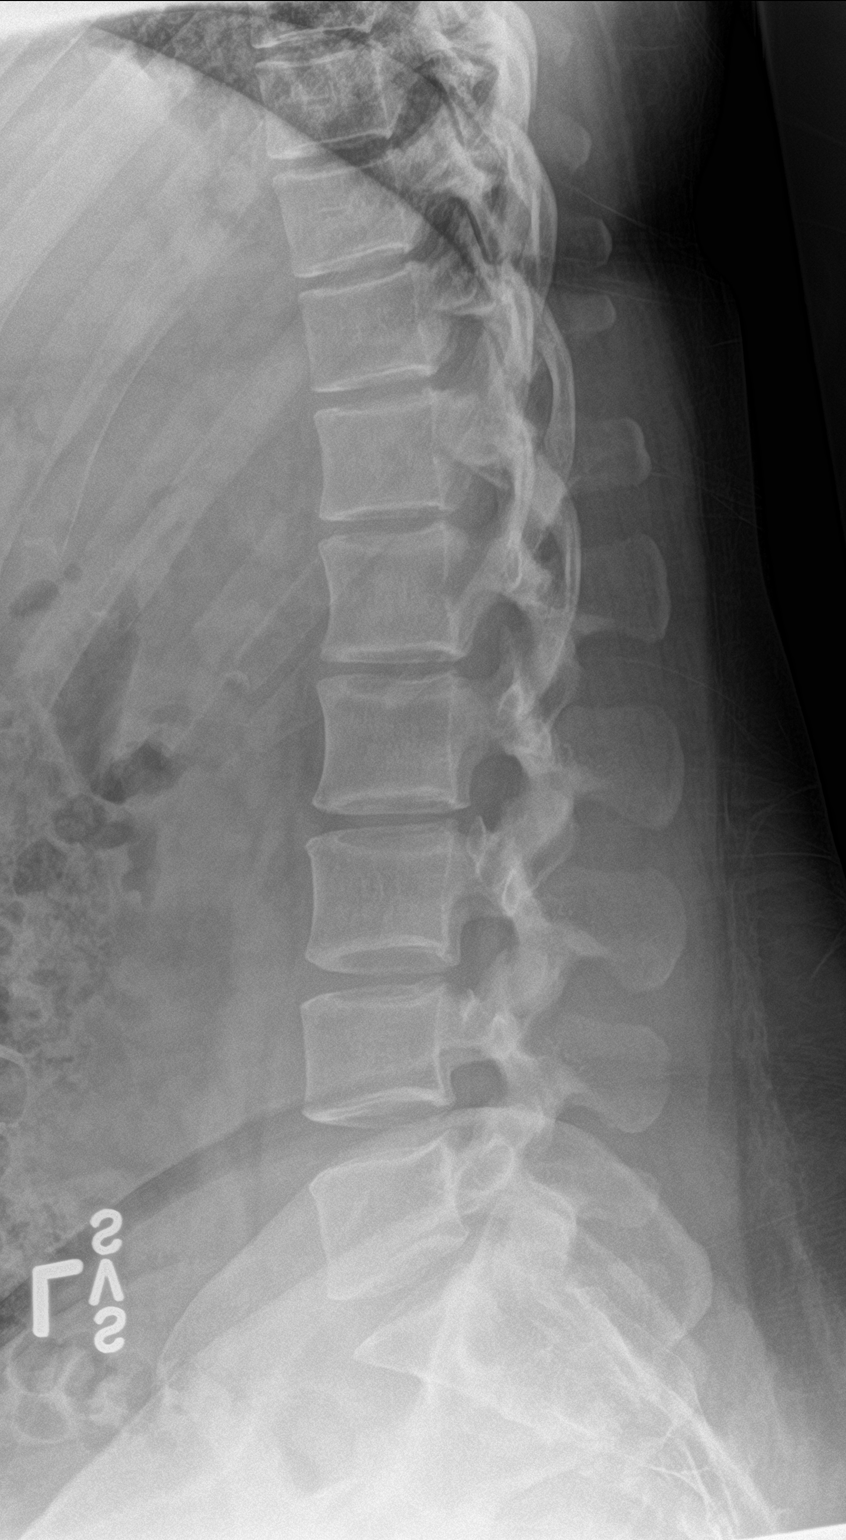

[2 of 2 positions shown; findings below may reference images not displayed]

FINDINGS: There are 5 non-rib-bearing lumbar vertebra. The alignment is
maintained. Vertebral body heights are normal. There is no
listhesis. The posterior elements are intact. Disc spaces are
preserved. No fracture. Sacroiliac joints are symmetric and normal.
IMPRESSION: Negative radiographs of the lumbar spine.
